# Patient Record
Sex: Female | Born: 1990 | Race: Black or African American | Hispanic: No | Marital: Single | Smoking: Never smoker
Health system: Southern US, Community
[De-identification: ages and names within clinical notes are randomized; demographics above are authoritative.]

## PROBLEM LIST (undated history)

## (undated) DIAGNOSIS — N926 Irregular menstruation, unspecified: Secondary | ICD-10-CM

## (undated) DIAGNOSIS — G4730 Sleep apnea, unspecified: Secondary | ICD-10-CM

## (undated) HISTORY — PX: LAPAROSCOPIC GASTRIC RESTRICTIVE DUODENAL PROCEDURE (DUODENAL SWITCH): SHX6667

## (undated) HISTORY — PX: ANTERIOR CRUCIATE LIGAMENT REPAIR: SHX115

---
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
## 2011-03-16 NOTE — H&P (Signed)
Sarah Horn, Sarah Horn            ACCOUNT NO.:  [id number]   MEDICAL RECORD NO.:  [id number]          PATIENT TYPE:  AMB   LOCATION:  DAY                           FACILITY:  [facility]   PHYSICIAN:  Vickki Hearing, M.D.DATE OF BIRTH:  1990/11/08   DATE OF ADMISSION:  DATE OF DISCHARGE:  LH                              HISTORY & PHYSICAL   CHIEF COMPLAINT:  Left knee pain.   HISTORY OF PRESENT ILLNESS:  This is a 20 year old female with left knee  pain.  On September 19, 2007, she had x-rays taken at [facility]  after an injury to the left knee.  The x-rays showed joint effusion but  no fracture.   She was injured chasing a ball at school.  She hit her head, lost  consciousness,her left knee hit the score board, and when she came to  the knee was still hurting.  She was given a knee brace, crutches  Vicodin, ibuprofen usual ice and compression elevation measures and came  to my office for evaluation.  She had a second fall getting out of the  shower.  She had a CT scan for workup of her loss of consciousness.  It  was normal.  She has been seen by neurologist Dr. Gerilyn Pilgrim, who has  cleared her for surgery in terms of general anesthetic.  She has been  reevaluated in the office with an MRI done on September 27, 2007, which  showed bone contusions, femoral and tibial, associated with complete  anterior cruciate ligament tear, medial and lateral collateral ligament  sprains without full-thickness tear, injuries to the meniscal root of  the posterior horn of the medial and lateral menisci, no full thickness  tear noted, delamination injury involving the medial femoral condyle  articular cartilage, and a large joint effusion.   On her last evaluation her swelling was down, her motion was sufficient  to allow for reconstruction with 120 degrees of knee flexion.   OTHER MEDICATIONS TAKEN:  None.   No known drug allergies.   Currently on Tylenol No. 3.   PAST MEDICAL HISTORY:   Negative.  No previous surgery.   PHYSICAL EXAM:  Weight 210, pulse 66, respiratory rate 14.  Her appearance was normal.  She had normal cardiovascular function.  Lymph system evaluation deferred.  Gait:  Weightbearing with crutches.  Range of motion the left knee is 0-  120.  Motor exam normal, both knees.  Stability tests left knee, 2+  Lachman, 2+ anterior drawer, pivot shift could not be performed.  Left  knee was still swollen.  SKIN:  Normal.  Neurovascular exam intact.  The patient was awake, alert and oriented x3.   Radiographs show closed growth plates, normal bone structure.   MRI findings noted above.   IMPRESSION:  1. Torn anterior cruciate ligament, left knee.  2. Torn medial meniscus, left knee.  3. Bone contusions.  4. Chondral flap tear, left medial femoral condyle.   PLAN:  Autograft ACL reconstruction, left knee.   Informed consent process was completed in the office.  We discussed  surgical and nonoperative treatment, plusses and  minuses of both,  potential complications of ACL reconstruction including but not limited  to re-tear, further episodes of instability, knee stiffness.  We  emphasized the need for postoperative rehabilitation and compliance with  that.  The patient understands she will have to wear a brace for 6  weeks.  This was done in the presence of the patient's mother as well as  the patient.      Vickki Hearing, M.D.  Electronically Signed     SEH/MEDQ  D:  10/12/2007  T:  10/13/2007  Job:  [id number]   cc:   [facility]  Fax: [phone]

## 2011-03-16 NOTE — Op Note (Signed)
NAMEMAURY, BAMBA            ACCOUNT NO.:  [id number]   MEDICAL RECORD NO.:  [id number]          PATIENT TYPE:  AMB   LOCATION:  DAY                           FACILITY:  [facility]   PHYSICIAN:  Vickki Hearing, M.D.DATE OF BIRTH:  21-Jun-1991   DATE OF PROCEDURE:  10/13/2007  DATE OF DISCHARGE:  10/13/2007                               OPERATIVE REPORT   PREOPERATIVE DIAGNOSIS:  Anterior cruciate ligament tear, left knee.   POSTOPERATIVE DIAGNOSIS:  Anterior cruciate ligament tear, left knee.   PROCEDURE:  Anterior cruciate ligament reconstruction with autograft,  left knee.  Graft was a bone-patellar-tendon-bone graft.   SURGEON:  Vickki Hearing, M.D. assisted by Trenton Founds.   ANESTHETIC:  General.   FINDINGS:  Torn ACL, chondral lesion medial femoral condyle and lateral  femoral condyle.  No specimens.  No complications.  Blood loss estimated  25 mL.  The patient did go to PACU in good condition.   BRIEF HISTORY:  This is a 20 year old female with left knee pain injured  on November 18.  X-rays were taken after the injury, showed a joint  effusion, no fracture.  The patient was injured when she was chasing a  ball at school, hit her head, lost consciousness, hit the left knee on  the score board, and was seen.  Had a CAT scan to workup her loss of  consciousness.  It was normal.  She was cleared by Dr. Gerilyn Pilgrim,  neurologist, in terms of surgical treatment needed for her knee.  MRI  done November 26 showed bone contusions consistent with anterior  cruciate ligament tear.  Medial and lateral collateral ligament sprains  were noted.  There was delamination involving the medial femoral condyle  with a large joint effusion.  The patient was seen in the office with  her mother.  Informed consent was obtained there.  Clinical signs of ACL  were present, and recommendations were made for surgery.  Options were  given for nonsurgery; the patient decided to have surgery  with the  consent of her mother.   The patient was identified in preoperative holding area as Donella Stade.  Left knee was marked surgery and countersigned by the  surgeon.  History and physical was updated.  The patient was taken to  surgery, given general anesthetic, and time-out procedure was then  completed.   Left knee was examined under anesthesia.  Collateral ligaments were  stable at 0 and 30 degrees.  Pivot shift and Lachman tests were  positive, grade 2 and 2, respectively.   Left leg was prepped and draped with sterile technique.   Tourniquet was elevated.  A second time out was taken.   Graft was taken first.  Straight incision was made over the patellar  tendon.  Subcutaneous tissue was divided.  Paratenon was isolated, split  in the midline of the patellar tendon.  The middle third of patellar  tendon was incised with bone blocks taken proximally and distally using  an oscillating saw.  Graft was repaired on the back table and placed on  a tensioner.   Diagnostic arthroscopy was  then done.  Diagnostic findings included torn  ACL, cartilage injuries of the medial and lateral femoral condyles.  These were debrided.  Menisci were evaluated and were found to be stable  with no tear.  Patellofemoral joint had some slight subluxation but  tracked back in the groove at 45 degrees flexion.   The ACL was then debrided.  The notch was debrided as well.  Notchplasty  performed.  Over-the-top position was identified with probe and  visualization.   Using the N + 7 rule, the tibial guide was set.  We used a 7-mm PCR  reference offset guide, placed in the ACL footprint posteriorly, drilled  our pin into the joint, over reamed with a 10-mm reamer, followed that  with over-the-top guide 7-mm offset, drilled the pin out the  anterolateral thigh, reamed that with a 10-mm reamer, then took a 9-mm  bone block graft, passed it through the joint and secured it with two   interference screws with the knee in extension.  The distal screw was  fixed.   Standard confirmation of proximal fixation was done prior to distal  fixation by pulling on the distal sutures.  Knee was cycled.   Once the knee was secured proximally and distally, Lachman test was  performed.  Lachman test was graded at 0.   The knee was taken through a range of motion.  Range of motion was found  to be full.   Knee was irrigated.  The graft was checked with probing and  visualization.  There was no impingement in the notch.  Closure was done  by closing the paratenon and the patellar tendon with interrupted 0  Monocryl sutures.  Subcutaneous layer was closed with 2-0 Monocryl, and  a subcutaneous running 3-0 Prolene stitch was used to close the skin.   The patient was placed in sterile dressings, Cryo/Cuff and a hinged  brace and extubated and taken to recovery room in stable condition.      Vickki Hearing, M.D.  Electronically Signed     SEH/MEDQ  D:  11/01/2007  T:  11/01/2007  Job:  [id number]

## 2011-06-10 ENCOUNTER — Other Ambulatory Visit: Payer: Self-pay | Admitting: Family Medicine

## 2011-06-10 DIAGNOSIS — N6320 Unspecified lump in the left breast, unspecified quadrant: Secondary | ICD-10-CM

## 2011-06-15 ENCOUNTER — Other Ambulatory Visit: Payer: Self-pay | Admitting: Obstetrics and Gynecology

## 2011-06-15 DIAGNOSIS — IMO0002 Reserved for concepts with insufficient information to code with codable children: Secondary | ICD-10-CM

## 2011-06-16 ENCOUNTER — Ambulatory Visit (HOSPITAL_COMMUNITY)
Admission: RE | Admit: 2011-06-16 | Discharge: 2011-06-16 | Disposition: A | Discharge status: Home / Self Care | Payer: Self-pay | Source: Ambulatory Visit | Attending: Obstetrics and Gynecology | Admitting: Obstetrics and Gynecology

## 2011-06-16 ENCOUNTER — Other Ambulatory Visit (HOSPITAL_COMMUNITY): Payer: Self-pay

## 2011-06-16 DIAGNOSIS — IMO0002 Reserved for concepts with insufficient information to code with codable children: Secondary | ICD-10-CM

## 2011-06-25 ENCOUNTER — Encounter (HOSPITAL_COMMUNITY)
Admission: RE | Admit: 2011-06-25 | Discharge: 2011-06-25 | Disposition: A | Discharge status: Home / Self Care | Payer: Medicaid Other | Source: Ambulatory Visit | Attending: General Surgery | Admitting: General Surgery

## 2011-06-25 ENCOUNTER — Encounter (HOSPITAL_COMMUNITY): Payer: Self-pay

## 2011-06-25 HISTORY — DX: Irregular menstruation, unspecified: N92.6

## 2011-06-25 LAB — SURGICAL PCR SCREEN: Staphylococcus aureus: NEGATIVE

## 2011-06-25 NOTE — Patient Instructions (Addendum)
20 Sarah Horn  06/25/2011   Your procedure is scheduled on:  Wednesday, 06/30/11  Report to [facility] at 06:15[facility].  Call this number if you have problems the morning of surgery: [phone]   Remember:   Do not eat food:After Midnight.  Do not drink clear liquids: After Midnight.  Take these medicines the morning of surgery with A SIP OF WATER: none   Do not wear jewelry, make-up or nail polish.  Do not wear lotions, powders, or perfumes. You may wear deodorant.  Do not shave 48 hours prior to surgery.  Do not bring valuables to the hospital.  Contacts, dentures or bridgework may not be worn into surgery.  Leave suitcase in the car. After surgery it may be brought to your room.  For patients admitted to the hospital, checkout time is 11:00 AM the day of discharge.   Patients discharged the day of surgery will not be allowed to drive home.  Name and phone number of your driver: Dirk Dress, mom  Special Instructions: CHG Shower Use Special Wash: 1/2 bottle night before surgery and 1/2 bottle morning of surgery.   Please read over the following fact sheets that you were given: MRSA Information, Surgical Site Infection Prevention, Anesthesia Post-op Instructions and Care and Recovery After Surgery Breast Biopsy Procedure WHY YOU NEED A BIOPSY Your caregiver has recommended that you have a breast tissue sample taken (biopsy). This is done to be certain that the lump or abnormality found in your breast is not cancerous (malignant). During a biopsy, a small piece of tissue is removed, so it can be examined under a microscope by a specialist (pathologist) who looks at tissues and cells and diagnoses abnormalities in them. Most lumps (tumors) or abnormalities, on or in the breast, are not cancerous (benign). However, biopsies are taken when your caregiver cannot be absolutely certain of what is wrong only from doing a physical exam, mammogram (breast X-ray), or other studies. A breast biopsy can  tell you whether nothing more needs to be done, or you need more surgery or another type of treatment. A biopsy is done when there is:  Any undiagnosed breast mass.   Nipple abnormalities, dimpling, crusting, ulcerations.   Calcium deposits (calcifications) or abnormalities seen on your mammogram, ultrasound, or MRI.   Suspicious changes in the breast (thickening, asymmetry) seen on mammogram.   Abnormal discharge from the nipple, especially blood.   Redness, swelling, and pain of the breast.  HOW A BIOPSY IS PERFORMED A biopsy is often performed on an outpatient basis (you go home the same day). This can be done in a hospital, clinic, or surgical center. Tissue samples (biopsies) are often done under local anesthesia (area is numbed). Sometimes general anesthetics are required, in which case you sleep through the procedure. Biopsies may remove the entire lump, a small piece of the lump, or a small sliver of tissue removed by needle. TYPES OF BREAST BIOPSY  Fine needle aspiration. A thin needle is placed through the skin, to the lump or cyst, and cells are removed.   Core needle biopsy. A large needle with a special tip is placed through the skin, to the abnormality, and a piece of tissue is removed.   Stereotactic biopsy. A core needle with a special X-ray is used, to direct the needle to the lump or abnormal area, which is difficult to feel or cannot be felt.   Vacuum-assisted biopsy. A hollow probe and a gentle vacuum remove a sample of tissue.  Ultrasound guided core needle biopsy. You lie on your stomach, with your breast through an opening, and a high frequency ultrasound helps guide the needle to the area of the abnormality.   Open biopsy. An incision is made in the breast, and a piece of the lump or the whole lump is removed.  BEFORE SURGERY  You should arrive  prior to your procedure, unless otherwise directed by your caregiver.   Check-in at the admissions desk, to fill  out necessary forms, if you are not preregistered.   There will be consent forms to sign, prior to the procedure.   There is a waiting area for your family, while you are having your biopsy.   Try to have someone with you, to drive you home.   Do not smoke for 2 weeks before the surgery.   Let your caregiver know if you develop a cold or an infection.   Do not drink alcohol for at least 24 hours before surgery.   Wear a good support bra to the surgery.  LET YOUR CAREGIVER KNOW ABOUT THE FOLLOWING:  Allergies.  Medicines taken, including herbs, eye drops, over-the-counter medicines, and creams.   Use of steroids (by mouth or creams).   Previous problems with anesthetics or Novocaine.   If you are taking aspirin or blood thinners.   Possibility of pregnancy, if this applies.  History of blood clots (thrombophlebitis).   History of bleeding or blood problems.   Previous surgery.   Other health problems.   RISKS AND COMPLICATIONS  Bleeding.   Infection.   Allergy to medicines.   Bruising and swelling of the breast.   Alteration in the shape of the breast.   Not finding the lump or abnormality.   Needing more surgery.  AFTER YOUR SURGERY  After surgery, you will be taken to the recovery area, where a nurse will watch and check your progress. Once you are awake, stable, and taking fluids well, if there are no other problems, you will be allowed to go home.   Ice packs applied to your operative site may help with discomfort and keep the swelling down.   You may resume normal diet and activities as directed. Avoid strenuous activities affecting the arm on the side of the biopsy, such as tennis, swimming, heavy lifting (more than 10 pounds) or pulling.   Bruising in the breast is normal following this procedure.   Wearing a support bra, even to bed, may be more comfortable. The bra will also help keep the dressing on.   Change dressings as directed.   Your  doctor may apply a pressure dressing on your breast for 24 to 48 hours.   Only take over-the-counter or prescription medicines for pain, discomfort, or fever as directed by your caregiver.   Do not take aspirin, because it can cause bleeding.  HOME CARE INSTRUCTIONS  You may resume your usual diet.   Have someone drive you home after the surgery.   Do not do any exercise, driving, lifting or general activities without your caregiver's permission.   Take medicines and over-the-counter medicines, as ordered by your caregiver.   Keep your postoperative appointments as recommended.   Do not drink alcohol while taking pain medicine.  FINDING OUT THE RESULTS OF YOUR TEST Not all test results are available during your visit. If your test results are not back during the visit, make an appointment with your caregiver to find out the results. Do not assume everything is normal if  you have not heard from your caregiver or the medical facility. It is important for you to follow up on all of your test results.  SEEK MEDICAL CARE IF:  You notice redness, swelling, or increasing pain in the wound.   You notice a bad smell coming from the wound or dressing.   You develop a rash.   You need stronger pain medicine.   You are having an allergic reaction or problems with your medicines.  SEEK IMMEDIATE MEDICAL CARE IF:  You have difficulty breathing.   You have an oral temperature above 101, not controlled by medicine.   There is increased bleeding (more than a small spot) from the wound.   Pus is coming from the wound.   The wound is breaking open.  Document Released: 10/18/2005 Document Re-Released: 11/09/2009 [facility] Patient Information 2011 [place], [place].Instructions Following General Anesthetic, Adult A nurse specialized in giving anesthesia (anesthetist) or a doctor specialized in giving anesthesia (anesthesiologist) gave you a medicine that made you sleep while a procedure was  performed. For as long as 24 hours following this procedure, you may feel:  Dizzy.   Weak.   Drowsy.  AFTER YOUR SURGERY 1. After surgery, you will be taken to the recovery area where a nurse will monitor your progress. You will be allowed to go home when you are awake, stable, taking fluids well, and without complications.  2.  3. For the first 24 hours following an anesthetic:   Have a responsible person with you.   Do not drive a car. If you are alone, do not take public transportation.   Do not drink alcohol.   Do not take medicine that has not been prescribed by your caregiver.   Do not sign important papers or make important decisions.   You may resume normal diet and activities as directed.   Change bandages (dressings) as directed.   Only take over-the-counter or prescription medicines for pain, discomfort, or fever as directed by your caregiver.  If you have questions or problems that seem related to the anesthetic, call the hospital and ask for the anesthetist or anesthesiologist on call. SEEK IMMEDIATE MEDICAL CARE IF:  You develop a rash.   You have difficulty breathing.   You have chest pain.   You develop any allergic problems.  Document Released: 01/24/2001 Document Re-Released: 01/12/2010 [facility] Patient Information 2011 [place], [place].

## 2011-06-29 NOTE — H&P (Signed)
  NTS SOAP Note  Vital Signs:  Vitals as of: 06/24/2011: Systolic 165: Diastolic 133: Heart Rate 84: Temp 38F: Height 93ft 6in: Weight 270Lbs 0 Ounces: Pain Level 0: BMI 44  BMI : 43.58 kg/m2  Subjective: This 19 Years 2 Months old Female presents for of breast mass on L side.  Noted last month on self exam.  No significant change in size.  No prior history of similar lesion or masses.  No nipple discharge or retraction.  No skin changes.  No noted change with cycle but notes the last several have been irregular.  No consistent cycle for 3 month intervals.  When menses occurs it is prolonged at 2 wk durations.  Has been recommended to start OCP to help but has not started do to finding of breast mass.  No night sweats.  No wt loss.  Review of Symptoms:  Constitutional:unremarkable Head:unremarkable Eyes:unremarkable Nose/Mouth/Throat:unremarkable Cardiovascular:unremarkable Respiratory:unremarkable Gastrointestinal:unremarkable Genitourinary:unremarkable Musculoskeletal:unremarkable Skin:unremarkable as per HPI Hematolgic/Lymphatic:unremarkable Allergic/Immunologic:unremarkable    Past Medical History:Obtained   Past Medical History  Pregnancy Gravida:  0 Pregnancy Para:  0 Surgical History: ACL Medical Problems: None Psychiatric History: none Allergies: NKDA Medications: None   Social History:Obtained   Social History  Preferred Language: English ([place]) Race:  Black or African American Ethnicity: Not Hispanic / Latino Age: 71 Years 2 Months Marital Status:  S Alcohol:  No Recreational drug(s):  No   Smoking Status: Never smoker reviewed on 06/24/2011  Family History:Obtained   Family History  Is there a family history ZO:XWRUEAVW Aunt Breast Ca 50's No female breast CA             Maternal Grandmother: female CA (unsure of type/non-breast)  50's   Medication Allergies:   Allergies Insert  Code:   Objective Information: General:Well appearing, well nourished in no distress.Obese. Skin:no rash or prominent lesions Head:Atraumatic; no masses; no abnormalities Eyes:conjunctiva clear, EOM intact, PERRL Mouth:Mucous membranes moist, no mucosal lesions. Neck:Supple without lymphadenopathy.  Heart:RRR, no murmur or gallop.  Normal S1, S2.  No S3, S4.  Lungs:CTA bilaterally, no wheezes, rhonchi, rales.  Breathing unlabored. Right breast is unremarkable.  No abnomalities noted.  On L side there is a 3x4 cm (roughly) regularly shaped mass.  mobile.  Nontender.  No LN on either side.  No nipple discharge. Abdomen:Soft, NT/ND, no HSM, no masses. Extremities:No deformities, clubbing, cyanosis, or edema.      U/S  L breast:  BIRADS 4 mass on L breast at 3 o'clock position. Assessment:  Diagnosis &amp; Procedure: DiagnosisCode: 611.72, ProcedureCode: 09811,   Orders:    Plan:  Options discussed including further radiological evaluation, guided biopsy, or surgical open bx.  Due to size and patient concern, she wishes to proceed with surgical bx.  Therefore, I do not feel that u/s guided is needed and this was discussed with the patient.  We will proceed at her earliest convenience.  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  Patient Education:Alternative treatments to surgery were discussed with patient (and family).Risks and benefits  of procedure were fully explained to the patient (and family) who gave informed consent. Patient/family questions were addressed.  Follow-up:Pending Surgery

## 2011-06-30 ENCOUNTER — Encounter (HOSPITAL_COMMUNITY): Payer: Self-pay | Admitting: *Deleted

## 2011-06-30 ENCOUNTER — Ambulatory Visit (HOSPITAL_COMMUNITY): Payer: Medicaid Other | Admitting: Anesthesiology

## 2011-06-30 ENCOUNTER — Other Ambulatory Visit: Payer: Self-pay | Admitting: General Surgery

## 2011-06-30 ENCOUNTER — Encounter (HOSPITAL_COMMUNITY)
Admission: RE | Disposition: A | Discharge status: Home / Self Care | Payer: Self-pay | Source: Ambulatory Visit | Attending: General Surgery

## 2011-06-30 ENCOUNTER — Ambulatory Visit (HOSPITAL_COMMUNITY)
Admission: RE | Admit: 2011-06-30 | Discharge: 2011-06-30 | Disposition: A | Discharge status: Home / Self Care | Payer: Medicaid Other | Source: Ambulatory Visit | Attending: General Surgery | Admitting: General Surgery

## 2011-06-30 ENCOUNTER — Encounter (HOSPITAL_COMMUNITY): Payer: Self-pay | Admitting: Anesthesiology

## 2011-06-30 DIAGNOSIS — N6320 Unspecified lump in the left breast, unspecified quadrant: Secondary | ICD-10-CM

## 2011-06-30 DIAGNOSIS — D249 Benign neoplasm of unspecified breast: Secondary | ICD-10-CM | POA: Insufficient documentation

## 2011-06-30 HISTORY — PX: BREAST BIOPSY: SHX20

## 2011-06-30 SURGERY — BREAST BIOPSY
Anesthesia: General | Site: Breast | Laterality: Left | Wound class: Clean

## 2011-06-30 MED ORDER — MIDAZOLAM HCL 2 MG/2ML IJ SOLN
INTRAMUSCULAR | Status: AC
  Filled 2011-06-30: qty 2

## 2011-06-30 MED ORDER — LACTATED RINGERS IV SOLN
INTRAVENOUS | Status: DC
  Administered 2011-06-30: 1000 mL via INTRAVENOUS
  Administered 2011-06-30: 08:00:00 via INTRAVENOUS

## 2011-06-30 MED ORDER — LIDOCAINE HCL (PF) 1 % IJ SOLN
INTRAMUSCULAR | Status: AC
  Filled 2011-06-30: qty 30

## 2011-06-30 MED ORDER — BUPIVACAINE HCL (PF) 0.5 % IJ SOLN
INTRAMUSCULAR | Status: AC
  Filled 2011-06-30: qty 30

## 2011-06-30 MED ORDER — FENTANYL CITRATE 0.05 MG/ML IJ SOLN
INTRAMUSCULAR | Status: DC | PRN
  Administered 2011-06-30 (×6): 50 ug via INTRAVENOUS

## 2011-06-30 MED ORDER — ONDANSETRON HCL 4 MG/2ML IJ SOLN: 4.0000 mg | Freq: Once | INTRAMUSCULAR | Status: DC | PRN

## 2011-06-30 MED ORDER — PROPOFOL 10 MG/ML IV EMUL
INTRAVENOUS | Status: AC
  Filled 2011-06-30: qty 20

## 2011-06-30 MED ORDER — FENTANYL CITRATE 0.05 MG/ML IJ SOLN
INTRAMUSCULAR | Status: AC
Start: 2011-06-30 — End: 2011-06-30
  Administered 2011-06-30: 25 ug via INTRAVENOUS
  Filled 2011-06-30: qty 2

## 2011-06-30 MED ORDER — HYDROCODONE-ACETAMINOPHEN 5-325 MG PO TABS: 1.0000 | ORAL_TABLET | ORAL | Status: AC | PRN

## 2011-06-30 MED ORDER — BUPIVACAINE HCL (PF) 0.5 % IJ SOLN
INTRAMUSCULAR | Status: DC | PRN
  Administered 2011-06-30: 10 mL

## 2011-06-30 MED ORDER — IPRATROPIUM-ALBUTEROL 18-103 MCG/ACT IN AERO
INHALATION_SPRAY | RESPIRATORY_TRACT | Status: DC | PRN
  Administered 2011-06-30: 2 via RESPIRATORY_TRACT

## 2011-06-30 MED ORDER — CELECOXIB 100 MG PO CAPS
ORAL_CAPSULE | ORAL | Status: AC
  Administered 2011-06-30: 100 mg via ORAL
  Filled 2011-06-30: qty 4

## 2011-06-30 MED ORDER — FENTANYL CITRATE 0.05 MG/ML IJ SOLN
25.0000 ug | INTRAMUSCULAR | Status: DC | PRN
  Administered 2011-06-30 (×2): 25 ug via INTRAVENOUS

## 2011-06-30 MED ORDER — FENTANYL CITRATE 0.05 MG/ML IJ SOLN
INTRAMUSCULAR | Status: AC
  Filled 2011-06-30: qty 2

## 2011-06-30 MED ORDER — LIDOCAINE HCL (PF) 1 % IJ SOLN
INTRAMUSCULAR | Status: AC
  Filled 2011-06-30: qty 5

## 2011-06-30 MED ORDER — PROPOFOL 10 MG/ML IV EMUL
INTRAVENOUS | Status: DC | PRN
  Administered 2011-06-30: 150 mg via INTRAVENOUS
  Administered 2011-06-30: 50 mg via INTRAVENOUS

## 2011-06-30 MED ORDER — SODIUM CHLORIDE 0.9 % IR SOLN
Status: DC | PRN
  Administered 2011-06-30: 1000 mL

## 2011-06-30 MED ORDER — MIDAZOLAM HCL 5 MG/5ML IJ SOLN
INTRAMUSCULAR | Status: DC | PRN
  Administered 2011-06-30 (×2): 1 mg via INTRAVENOUS

## 2011-06-30 MED ORDER — LIDOCAINE HCL 1 % IJ SOLN
INTRAMUSCULAR | Status: DC | PRN
  Administered 2011-06-30: 10 mg via INTRADERMAL

## 2011-06-30 MED ORDER — CELECOXIB 100 MG PO CAPS
400.0000 mg | ORAL_CAPSULE | Freq: Every day | ORAL | Status: AC
  Administered 2011-06-30 (×4): 100 mg via ORAL

## 2011-06-30 MED ORDER — FENTANYL CITRATE 0.05 MG/ML IJ SOLN
INTRAMUSCULAR | Status: AC
  Administered 2011-06-30: 25 ug via INTRAVENOUS
  Filled 2011-06-30: qty 2

## 2011-06-30 MED ORDER — ALBUTEROL SULFATE HFA 108 (90 BASE) MCG/ACT IN AERS
INHALATION_SPRAY | RESPIRATORY_TRACT | Status: AC
  Filled 2011-06-30: qty 6.7

## 2011-06-30 MED ORDER — MIDAZOLAM HCL 2 MG/2ML IJ SOLN
1.0000 mg | INTRAMUSCULAR | Status: DC | PRN
  Administered 2011-06-30: 2 mg via INTRAVENOUS

## 2011-06-30 SURGICAL SUPPLY — 35 items
APL SKNCLS STERI-STRIP NONHPOA (GAUZE/BANDAGES/DRESSINGS) ×1
BAG HAMPER (MISCELLANEOUS) ×2 IMPLANT
BENZOIN TINCTURE PRP APPL 2/3 (GAUZE/BANDAGES/DRESSINGS) ×2 IMPLANT
CLOSURE STERI STRIP 1/2 X4 (GAUZE/BANDAGES/DRESSINGS) ×1 IMPLANT
CLOTH BEACON ORANGE TIMEOUT ST (SAFETY) ×2 IMPLANT
COVER LIGHT HANDLE STERIS (MISCELLANEOUS) ×4 IMPLANT
DURAPREP 26ML APPLICATOR (WOUND CARE) ×2 IMPLANT
ELECT REM PT RETURN 9FT ADLT (ELECTROSURGICAL) ×2
ELECTRODE REM PT RTRN 9FT ADLT (ELECTROSURGICAL) ×1 IMPLANT
FORMALIN 10 PREFIL 120ML (MISCELLANEOUS) ×2 IMPLANT
GLOVE BIOGEL PI IND STRL 7.0 (GLOVE) IMPLANT
GLOVE BIOGEL PI IND STRL 7.5 (GLOVE) ×1 IMPLANT
GLOVE BIOGEL PI INDICATOR 7.0 (GLOVE) ×2
GLOVE BIOGEL PI INDICATOR 7.5 (GLOVE) ×1
GLOVE ECLIPSE 7.0 STRL STRAW (GLOVE) ×2 IMPLANT
GLOVE OPTIFIT SS 6.5 STRL BRWN (GLOVE) ×1 IMPLANT
GOWN BRE IMP SLV AUR XL STRL (GOWN DISPOSABLE) ×6 IMPLANT
KIT ROOM TURNOVER APOR (KITS) ×2 IMPLANT
MANIFOLD NEPTUNE II (INSTRUMENTS) ×2 IMPLANT
MARKER SKIN DUAL TIP RULER LAB (MISCELLANEOUS) ×2 IMPLANT
NDL HYPO 18GX1.5 BLUNT FILL (NEEDLE) IMPLANT
NDL HYPO 25X1 1.5 SAFETY (NEEDLE) IMPLANT
NEEDLE HYPO 18GX1.5 BLUNT FILL (NEEDLE) IMPLANT
NEEDLE HYPO 25X1 1.5 SAFETY (NEEDLE) IMPLANT
NS IRRIG 1000ML POUR BTL (IV SOLUTION) ×2 IMPLANT
PACK MINOR (CUSTOM PROCEDURE TRAY) ×2 IMPLANT
PAD ARMBOARD 7.5X6 YLW CONV (MISCELLANEOUS) ×2 IMPLANT
SET BASIN LINEN APH (SET/KITS/TRAYS/PACK) ×2 IMPLANT
SPONGE GAUZE 2X2 8PLY STRL LF (GAUZE/BANDAGES/DRESSINGS) IMPLANT
STRIP CLOSURE SKIN 1/2X4 (GAUZE/BANDAGES/DRESSINGS) ×2 IMPLANT
SUT MNCRL AB 4-0 PS2 18 (SUTURE) IMPLANT
SUT VIC AB 3-0 SH 27 (SUTURE)
SUT VIC AB 3-0 SH 27X BRD (SUTURE) IMPLANT
SYR BULB IRRIGATION 50ML (SYRINGE) ×2 IMPLANT
SYR CONTROL 10ML LL (SYRINGE) IMPLANT

## 2011-06-30 NOTE — Anesthesia Preprocedure Evaluation (Signed)
Anesthesia Evaluation  Name, MR# and DOB Patient awake  General Assessment Comment  Reviewed: Allergy & Precautions, H&P , NPO status , Patient's Chart, lab work & pertinent test results, reviewed documented beta blocker date and time   History of Anesthesia Complications Negative for: history of anesthetic complications  Airway Mallampati: III  Neck ROM: Full    Dental  (+) Teeth Intact   Pulmonary    pulmonary exam normalPulmonary Exam Normal     Cardiovascular Regular Normal    Neuro/Psych   GI/Hepatic/Renal   Endo/Other    Abdominal   Musculoskeletal   Hematology   Peds  Reproductive/Obstetrics    Anesthesia Other Findings             Anesthesia Physical Anesthesia Plan  ASA: I  Anesthesia Plan: General   Post-op Pain Management:    Induction: Intravenous  Airway Management Planned: LMA  Additional Equipment:   Intra-op Plan:   Post-operative Plan:   Informed Consent: I have reviewed the patients History and Physical, chart, labs and discussed the procedure including the risks, benefits and alternatives for the proposed anesthesia with the patient or authorized representative who has indicated his/her understanding and acceptance.     Plan Discussed with:   Anesthesia Plan Comments:         Anesthesia Quick Evaluation

## 2011-06-30 NOTE — Anesthesia Postprocedure Evaluation (Signed)
  Anesthesia Post-op Note  Patient: Sarah Horn  Procedure(s) Performed:  BREAST BIOPSY  Patient Location: PACU  Anesthesia Type: General  Level of Consciousness: alert   Airway and Oxygen Therapy: Patient Spontanous Breathing  Post-op Pain: mild  Post-op Assessment: Patient's Cardiovascular Status Stable, Respiratory Function Stable, Patent Airway and No signs of Nausea or vomiting  Post-op Vital Signs: stable  Complications: No apparent anesthesia complications

## 2011-06-30 NOTE — Anesthesia Procedure Notes (Addendum)
Procedure Name: LMA Insertion Date/Time: 06/30/2011 8:17 AM Performed by: Minerva Areola Pre-anesthesia Checklist: Patient identified, Patient being monitored, Emergency Drugs available, Timeout performed and Suction available Patient Re-evaluated:Patient Re-evaluated prior to inductionOxygen Delivery Method: Circle System Utilized Preoxygenation: Pre-oxygenation with 100% oxygen Intubation Type: IV induction Ventilation: Mask ventilation without difficulty LMA: LMA inserted LMA Size: 4.0 Number of attempts: 1 Placement Confirmation: positive ETCO2 and breath sounds checked- equal and bilateral

## 2011-06-30 NOTE — Op Note (Signed)
Patient:  Sarah Horn  DOB:  19-Aug-1991  MRN:  [id number]   Preop Diagnosis:  Left breast mass  Postop Diagnosis:  The same  Procedure:  Excisional biopsy of left breast mass  Surgeon:  Dr. Tilford Pillar  Anes:  General endotracheal  Indications:  Patient is a 20 year old female presented to my office with a history of a palpable mass on the upper outer quadrant of the left breast at approximately the 3:00 position. Due to her concerns and desire to have the mass removed we did discuss excisional biopsy. Risks benefits alternatives were discussed. She consented for the planned procedure.  Procedure note:  Patient was taken to the OR was placed in a supine position on the OR table. At this point general anesthetic was mentioned and the patient was endotracheally intubated by anesthesia. At this point her left chest and arm was prepped with DuraPrep solution. The surgical field was draped in standard fashion. A marking pen was utilized to mark the planned periareolar incision. A 15 blade scalpel was utilized to create the initial skin incision an additional dissection down through the breast tissue was carried out using electrocautery. The mass was identified and carefully dissected around again using electrocautery. Upon adequately mobilizing it was delivered up into the surgical field. At this point the posterior attachments were divided again using electrocautery. Once free the mass was sent as a permanent specimen to pathology. Palpation of the surgical wound did not demonstrate any additional nodularity or masses. Hemostasis was excellent the surgical field was irrigated with a copious amount of sterile saline. The deep breast tissue was reapproximated using a 3-0 Vicryl in running continuous fashion. The skin edges were reapproximated using a 4-0 Monocryl a running continuous subcuticular stitch. The skin was washed dried moist dry towel after the local anesthetic was instilled. Benzoin  is applied around the incision. Half-inch Steri-Strips are placed. The drapes removed the patient was allowed to come out of general anesthetic was transferred back to a regular hospital bed in stable condition. At the conclusion of procedure all instrument sponge and needle counts are correct. Patient tolerated procedure extremely well.  Complications:  None  EBL:  Minimal  Specimen:  Left breast mass

## 2011-06-30 NOTE — Transfer of Care (Signed)
Immediate Anesthesia Transfer of Care Note  Patient: Sarah Horn  Procedure(s) Performed:  BREAST BIOPSY  Patient Location: PACU  Anesthesia Type: General  Level of Consciousness: awake  Airway & Oxygen Therapy: Patient Spontanous Breathing and non-rebreather face mask  Post-op Assessment: Report given to PACU RN, Post -op Vital signs reviewed and stable and Patient moving all extremities  Post vital signs: Reviewed and stable  Complications: No apparent anesthesia complications

## 2011-06-30 NOTE — Progress Notes (Signed)
Ice pack to site

## 2011-06-30 NOTE — Interval H&P Note (Signed)
History and Physical Interval Note:   06/30/2011   7:53 AM   Sarah Horn  has presented today for surgery, with the diagnosis of Breast mass [611.72]  The various methods of treatment have been discussed with the patient and family. After consideration of risks, benefits and other options for treatment, the patient has consented to  Procedure(s): BREAST BIOPSY as a surgical intervention .  I have reviewed the patients' chart and labs.  Questions were answered to the patient's satisfaction.     Fabio Bering  MD

## 2011-07-06 ENCOUNTER — Encounter (HOSPITAL_COMMUNITY): Payer: Self-pay | Admitting: General Surgery

## 2011-08-09 LAB — HCG, QUANTITATIVE, PREGNANCY: hCG, Beta Chain, Quant, S: <2

## 2011-08-09 LAB — HEMOGLOBIN AND HEMATOCRIT, BLOOD
HCT: 38.5
Hemoglobin: 12.8

## 2011-08-10 LAB — URINALYSIS, ROUTINE W REFLEX MICROSCOPIC
Bilirubin Urine: NEGATIVE
Hgb urine dipstick: NEGATIVE
Ketones, ur: NEGATIVE
Nitrite: NEGATIVE
Specific Gravity, Urine: 1.02
pH: 6.5

## 2012-11-04 IMAGING — US US BREAST*L*
1 series · 4 of 4 positions shown · non-contrast
Comparison: None.

CLINICAL DATA: Questioned palpable mass left breast three o'clock
location per patient.

LEFT BREAST ULTRASOUND

[Series 1: us breast*left* · 0.08mm/px · 4 of 4 slices shown]
[im 1/4]
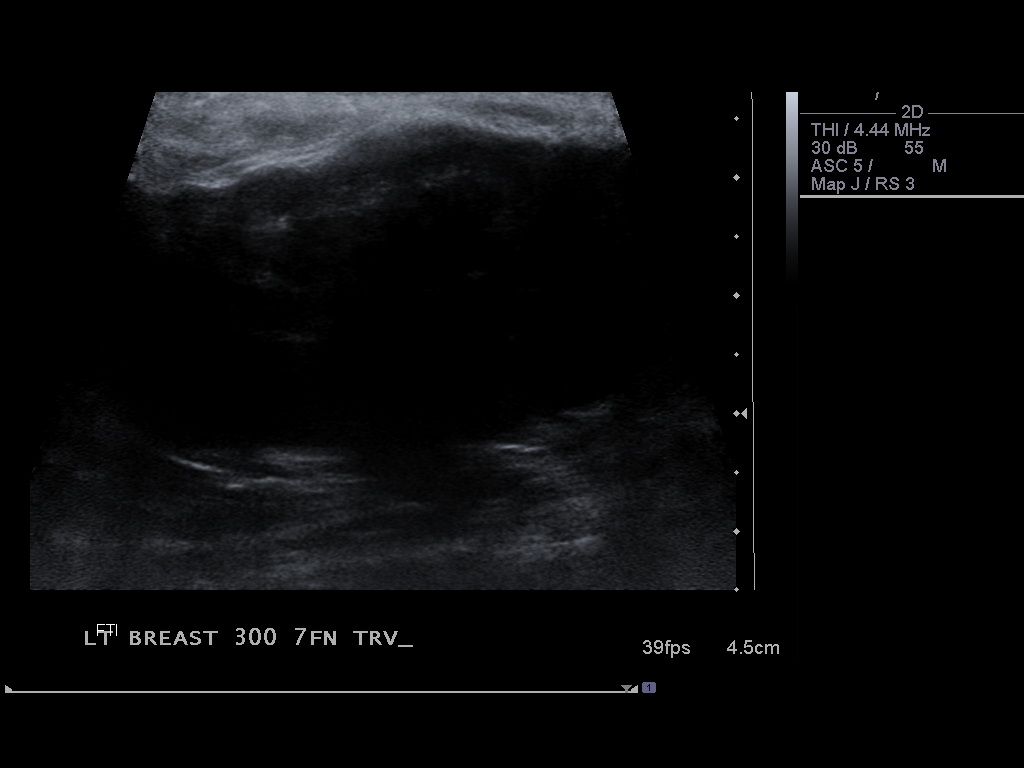
[im 2/4]
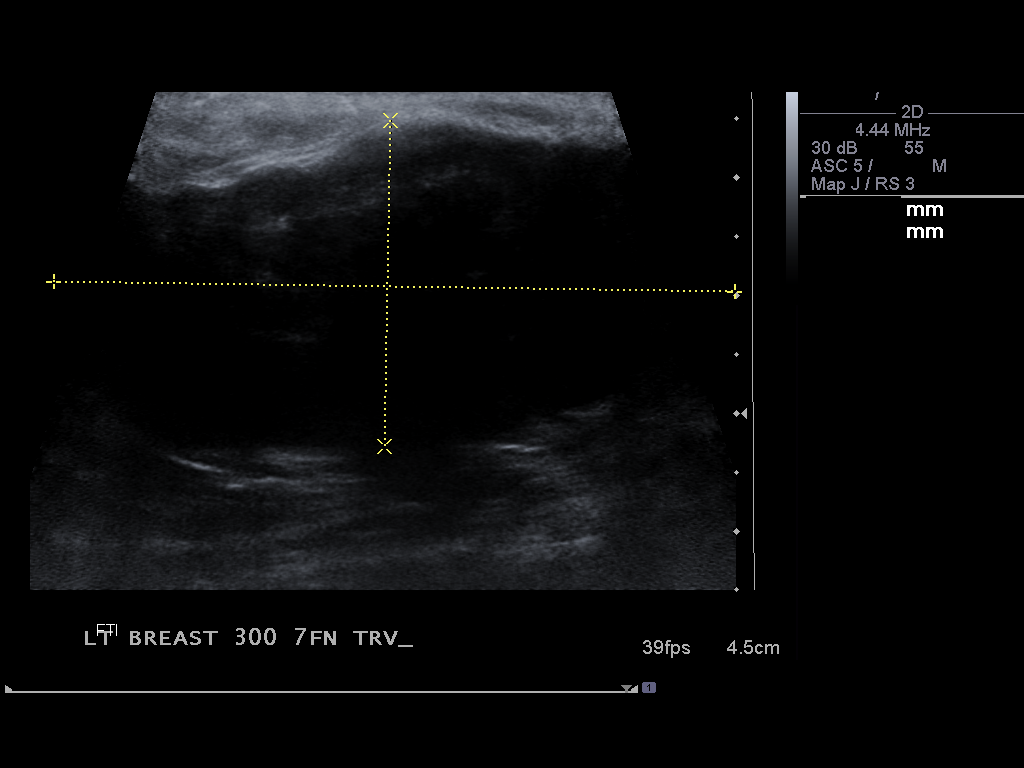
[im 3/4]
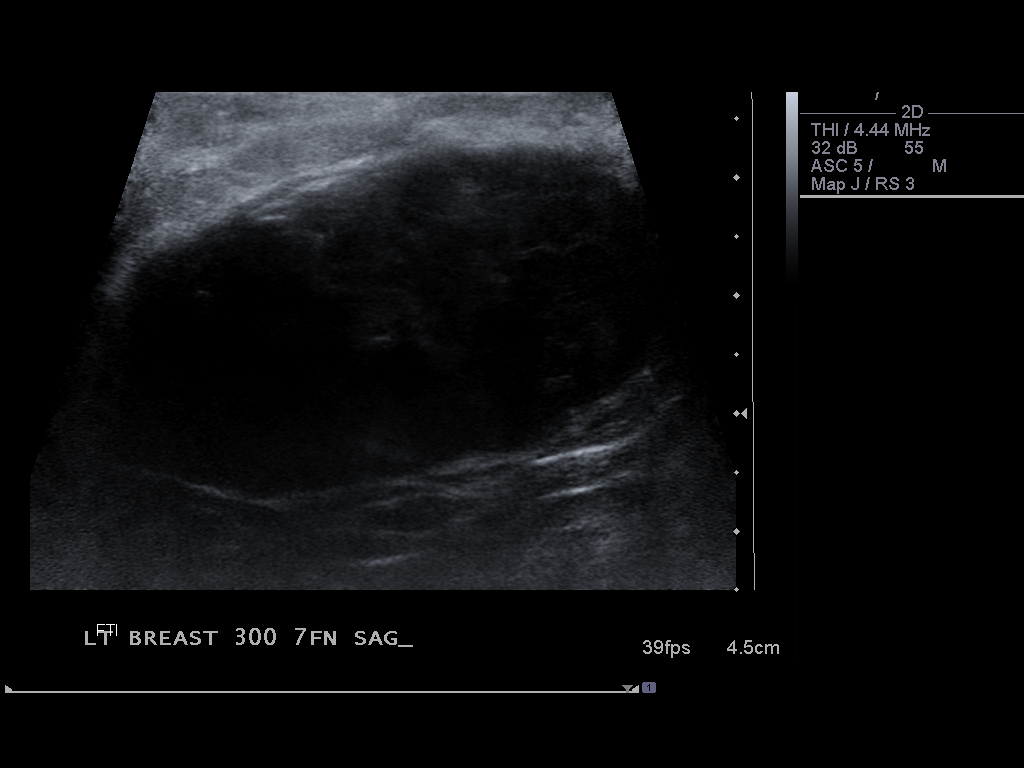
[im 4/4]
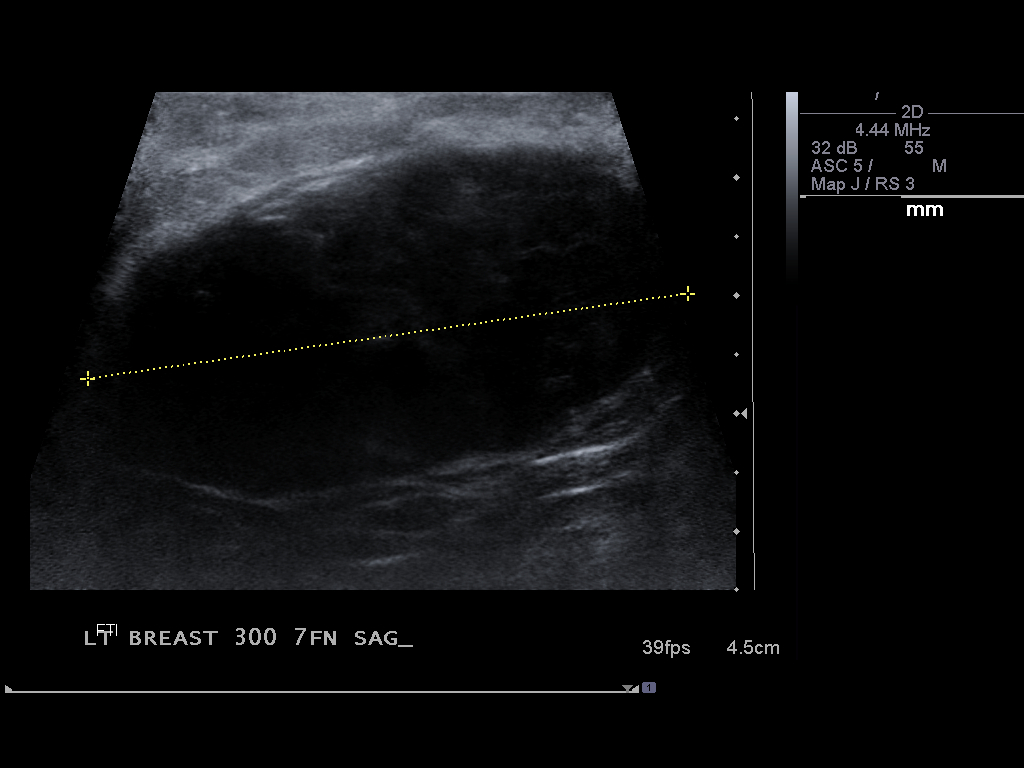

[4 of 4 positions shown; findings below may reference images not displayed]

On physical exam, I palpate a mobile oval mass spanning
approximately 5-6 cm in the left breast three o'clock location.
FINDINGS: Ultrasound is performed, showing a hypoechoic oval
circumscribed mass in the left breast three o'clock location
measuring at least 5.8 x 5.2 x 2.8 cm.  This corresponds to the
questioned palpable finding.
IMPRESSION: Left breast mass 3 o'clock location with imaging features and size
suggestive of a giant fibroadenoma versus phyllodes tumor.
Malignancy is less likely but possible.  As part of the free
clinic/[facility] program, surgical consultation will be arranged
prior to ultrasound-guided left breast core needle biopsy for the
purposes of preoperative surgical planning.  Ultimately, excision
is recommended due to the size of the mass. Findings and
recommendations discussed with the patient and provided in written
form at the time of the exam. Zohar Hijazi at the free clinic of
[HOSPITAL] was also contacted with this information and surgical
consultation and ultrasound guided core needle biopsy will be
arranged subsequently.

BI-RADS CATEGORY 4:  Suspicious abnormality - biopsy should be
considered.

## 2013-07-11 ENCOUNTER — Emergency Department (HOSPITAL_COMMUNITY)
Admission: EM | Admit: 2013-07-11 | Discharge: 2013-07-11 | Disposition: A | Discharge status: Home / Self Care | Payer: PRIVATE HEALTH INSURANCE | Attending: Emergency Medicine | Admitting: Emergency Medicine

## 2013-07-11 ENCOUNTER — Encounter (HOSPITAL_COMMUNITY): Payer: Self-pay | Admitting: *Deleted

## 2013-07-11 ENCOUNTER — Emergency Department (HOSPITAL_COMMUNITY): Payer: PRIVATE HEALTH INSURANCE

## 2013-07-11 DIAGNOSIS — S8990XA Unspecified injury of unspecified lower leg, initial encounter: Secondary | ICD-10-CM | POA: Insufficient documentation

## 2013-07-11 DIAGNOSIS — Z8742 Personal history of other diseases of the female genital tract: Secondary | ICD-10-CM | POA: Insufficient documentation

## 2013-07-11 DIAGNOSIS — R296 Repeated falls: Secondary | ICD-10-CM | POA: Insufficient documentation

## 2013-07-11 DIAGNOSIS — Y9229 Other specified public building as the place of occurrence of the external cause: Secondary | ICD-10-CM | POA: Insufficient documentation

## 2013-07-11 DIAGNOSIS — Y9302 Activity, running: Secondary | ICD-10-CM | POA: Insufficient documentation

## 2013-07-11 DIAGNOSIS — M25562 Pain in left knee: Secondary | ICD-10-CM

## 2013-07-11 MED ORDER — IBUPROFEN 800 MG PO TABS: 800.0000 mg | ORAL_TABLET | Freq: Three times a day (TID) | ORAL | Status: DC

## 2013-07-11 MED ORDER — IBUPROFEN 800 MG PO TABS
800.0000 mg | ORAL_TABLET | Freq: Once | ORAL | Status: AC
  Administered 2013-07-11: 800 mg via ORAL
  Filled 2013-07-11: qty 1

## 2013-07-11 MED ORDER — HYDROCODONE-ACETAMINOPHEN 5-325 MG PO TABS: ORAL_TABLET | ORAL | Status: DC

## 2013-07-11 MED ORDER — HYDROCODONE-ACETAMINOPHEN 5-325 MG PO TABS
2.0000 | ORAL_TABLET | Freq: Once | ORAL | Status: AC
  Administered 2013-07-11: 2 via ORAL
  Filled 2013-07-11: qty 2

## 2013-07-11 NOTE — ED Notes (Signed)
Pt at school for PT and had left knee buckle and fell, states able to put some weight on knee

## 2013-07-13 NOTE — ED Provider Notes (Signed)
CSN: [id number]     Arrival date & time 07/11/13  1719 History   First MD Initiated Contact with Patient 07/11/13 1732     Chief Complaint  Patient presents with  . Knee Pain   (Consider location/radiation/quality/duration/timing/severity/associated sxs/prior Treatment) Patient is a 22 y.o. female presenting with knee pain. The history is provided by the patient.  Knee Pain Location:  Knee Time since incident: just prior to ED arrival. Injury: yes   Mechanism of injury: fall   Fall:    Fall occurred:  Running   Impact surface:  Designer, fashion/clothing of impact:  Knees   Entrapped after fall: no   Knee location:  L knee Pain details:    Quality:  Throbbing   Radiates to:  Does not radiate   Severity:  Moderate   Onset quality:  Sudden   Timing:  Constant   Progression:  Unchanged Chronicity:  New Dislocation: no   Foreign body present:  No foreign bodies Prior injury to area:  Yes (hx of ACL repair as a teen) Relieved by:  Nothing Worsened by:  Bearing weight Ineffective treatments:  None tried Associated symptoms: no back pain, no fever, no neck pain, no numbness, no swelling and no tingling     Past Medical History  Diagnosis Date  . Irregular menstrual cycle    Past Surgical History  Procedure Laterality Date  . Anterior cruciate ligament repair      left, [facility]; [place]  . Breast biopsy  06/30/2011    Procedure: BREAST BIOPSY;  Surgeon: Fabio Bering;  Location: [facility];  Service: General;  Laterality: Left;   Family History  Problem Relation Age of Onset  . Anesthesia problems Neg Hx    History  Substance Use Topics  . Smoking status: Never Smoker   . Smokeless tobacco: Not on file  . Alcohol Use: No   OB History   Grav Para Term Preterm Abortions TAB SAB Ect Mult Living                 Review of Systems  Constitutional: Negative for fever and chills.  HENT: Negative for neck pain.   Musculoskeletal: Positive for joint swelling and arthralgias. Negative for  back pain.  Skin: Negative for color change and wound.  All other systems reviewed and are negative.    Allergies  Review of patient's allergies indicates no known allergies.  Home Medications   Current Outpatient Rx  Name  Route  Sig  Dispense  Refill  . ibuprofen (ADVIL,MOTRIN) 800 MG tablet   Oral   Take 800 mg by mouth every 8 (eight) hours as needed for pain.          Marland Kitchen HYDROcodone-acetaminophen (NORCO/VICODIN) 5-325 MG per tablet      Take one-two tabs po q 4-6 hrs prn pain   20 tablet   0   . ibuprofen (ADVIL,MOTRIN) 800 MG tablet   Oral   Take 1 tablet (800 mg total) by mouth 3 (three) times daily.   21 tablet   0    BP 118/77  Pulse 92  Temp(Src) 98.3 F (36.8 C) (Oral)  Resp 20  Ht 5\' 5"  (1.651 m)  Wt 260 lb (117.935 kg)  BMI 43.27 kg/m2  SpO2 99%  LMP 06/22/2013 Physical Exam  Nursing note and vitals reviewed. Constitutional: She is oriented to person, place, and time. She appears well-developed and well-nourished. No distress.  Cardiovascular: Normal rate, regular rhythm, normal heart sounds and  intact distal pulses.   Pulmonary/Chest: Effort normal and breath sounds normal. No respiratory distress.  Musculoskeletal: She exhibits tenderness. She exhibits no edema.       Left knee: She exhibits decreased range of motion. She exhibits no swelling, no effusion, no ecchymosis, no deformity, no erythema and normal patellar mobility. Tenderness found. Medial joint line tenderness noted. No patellar tendon tenderness noted.       Legs: ttp of the anterior left knee.  No erythema, effusion, or step-off deformity.  DP pulse brisk, distal sensation intact. Calf is soft and NT.  Neurological: She is alert and oriented to person, place, and time. She exhibits normal muscle tone. Coordination normal.  Skin: Skin is warm and dry. No erythema.    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  Relatively preserved joint spaces.  No acute fracture or large joint effusion. Obese body habitus.  Postop changes from prior ACL repair.  IMPRESSION: Progressive arthritic changes.  Prior ACL reconstruction.  No acute finding by plain radiography.   Original Report Authenticated By: Judie Petit. Miles Costain, M.D.    MDM   1. Knee pain, acute, left     Knee immobilizer applied, crutches given.  Pain improved, remains nv intact.    Given hx of previous ACL injury, patient advised of possible internal derangement.  She agrees to elevate, ice and close f/u with orthopedics.  Referral info given.    Patient is feeling better, appears stable for discharge.      Lesha Jager L. Trisha Mangle, PA-C 07/13/13 2138

## 2013-07-17 ENCOUNTER — Encounter: Payer: Self-pay | Admitting: Orthopedic Surgery

## 2013-07-17 ENCOUNTER — Ambulatory Visit (INDEPENDENT_AMBULATORY_CARE_PROVIDER_SITE_OTHER): Payer: PRIVATE HEALTH INSURANCE | Admitting: Orthopedic Surgery

## 2013-07-17 VITALS — BP 148/88 | Ht 65.0 in | Wt 260.0 lb

## 2013-07-17 DIAGNOSIS — S83242A Other tear of medial meniscus, current injury, left knee, initial encounter: Secondary | ICD-10-CM

## 2013-07-17 DIAGNOSIS — IMO0002 Reserved for concepts with insufficient information to code with codable children: Secondary | ICD-10-CM

## 2013-07-17 MED ORDER — IBUPROFEN 800 MG PO TABS: 800.0000 mg | ORAL_TABLET | Freq: Three times a day (TID) | ORAL | Status: DC

## 2013-07-17 MED ORDER — HYDROCODONE-ACETAMINOPHEN 5-325 MG PO TABS: 1.0000 | ORAL_TABLET | ORAL | Status: DC | PRN

## 2013-07-17 NOTE — Progress Notes (Signed)
Patient ID: Sarah Horn, female   DOB: 11-Mar-1991, 22 y.o.   MRN: [id number]  Chief Complaint  Patient presents with  . Knee Pain    Left knee pain d/t injury 07/11/13    22 year old female status post anterior cruciate ligament reconstruction left knee in 2009 with patella tendon autograft she also a chondral lesions of her medial and lateral condyles but no meniscal tear presents after tripping over on level concrete felt something pop in her knee she fell. She went to the emergency room x-rays were negative. She complains of medial dull throbbing aching knee pain moderate to severe present for 6 days associated with swelling and loss of motion.  Review of systems negative  Exam vital signs are.vs Ht 5\' 5"  (1.651 m)  Wt 260 lb (117.935 kg)  BMI 43.27 kg/m2  LMP 06/22/2013  She does have some obesity but she is well-developed and well-nourished grooming and hygiene are normal. She is oriented to person place and time and her mood is pleasant. She is ambulatory with crutches partial weightbearing with the brace. Upper extremity show no abnormalities  Left knee has a anterior incision which is nontender there is a joint effusion and tenderness on the medial joint line and posterior aspect of the knee. Her range of motion is 0-90 and painful her graft feels stable. Muscle tone is normal skin is intact she does appear to have a positive McMurray sign good pulse distally normal sensation is noted as well.  Recommend MRI left knee for torn medial meniscus we can also evaluate the graft at that time. If the meniscus was torn we would proceed with arthroscopic surgery  She is to continue crutches weight-bear as tolerated continue ibuprofen and Vicodin or Norco for pain ice 3 times a day follow up after MRI.

## 2013-07-17 NOTE — Patient Instructions (Addendum)
MRI ordered Ice 3 times day for 20 minutes Crutches weight bear as tolerated

## 2013-07-17 NOTE — ED Provider Notes (Signed)
Medical screening examination/treatment/procedure(s) were performed by non-physician practitioner and as supervising physician I was immediately available for consultation/collaboration.    Christopher J. Pollina, MD 07/17/13 1525 

## 2013-07-18 ENCOUNTER — Telehealth: Payer: Self-pay | Admitting: *Deleted

## 2013-07-18 NOTE — Telephone Encounter (Signed)
No pre-cert required for any services per Delaney Meigs with [employer]. Scheduled for 07/23/13 5:45 pm at [facility] Follow up scheduled for 07/31/13 at 1:45 pm Patient aware of dates and times

## 2013-07-23 ENCOUNTER — Ambulatory Visit (HOSPITAL_COMMUNITY)
Admission: RE | Admit: 2013-07-23 | Discharge: 2013-07-23 | Disposition: A | Discharge status: Home / Self Care | Payer: PRIVATE HEALTH INSURANCE | Source: Ambulatory Visit | Attending: Orthopedic Surgery | Admitting: Orthopedic Surgery

## 2013-07-23 DIAGNOSIS — M76899 Other specified enthesopathies of unspecified lower limb, excluding foot: Secondary | ICD-10-CM | POA: Insufficient documentation

## 2013-07-23 DIAGNOSIS — S83509A Sprain of unspecified cruciate ligament of unspecified knee, initial encounter: Secondary | ICD-10-CM | POA: Insufficient documentation

## 2013-07-23 DIAGNOSIS — S8990XA Unspecified injury of unspecified lower leg, initial encounter: Secondary | ICD-10-CM | POA: Insufficient documentation

## 2013-07-23 DIAGNOSIS — X500XXA Overexertion from strenuous movement or load, initial encounter: Secondary | ICD-10-CM | POA: Insufficient documentation

## 2013-07-23 DIAGNOSIS — S83242A Other tear of medial meniscus, current injury, left knee, initial encounter: Secondary | ICD-10-CM

## 2013-07-23 DIAGNOSIS — M25569 Pain in unspecified knee: Secondary | ICD-10-CM | POA: Insufficient documentation

## 2013-07-23 DIAGNOSIS — S83419A Sprain of medial collateral ligament of unspecified knee, initial encounter: Secondary | ICD-10-CM | POA: Insufficient documentation

## 2013-07-31 ENCOUNTER — Other Ambulatory Visit: Payer: Self-pay | Admitting: *Deleted

## 2013-07-31 ENCOUNTER — Encounter: Payer: Self-pay | Admitting: Orthopedic Surgery

## 2013-07-31 ENCOUNTER — Ambulatory Visit (INDEPENDENT_AMBULATORY_CARE_PROVIDER_SITE_OTHER): Payer: PRIVATE HEALTH INSURANCE | Admitting: Orthopedic Surgery

## 2013-07-31 VITALS — BP 117/81 | Ht 65.0 in | Wt 260.0 lb

## 2013-07-31 DIAGNOSIS — IMO0002 Reserved for concepts with insufficient information to code with codable children: Secondary | ICD-10-CM

## 2013-07-31 NOTE — Patient Instructions (Addendum)
Surgery  Give [facility] note for 6 weeks     29880  Arthroscopic Procedure, Knee An arthroscopic procedure can find what is wrong with your knee. PROCEDURE Arthroscopy is a surgical technique that allows your orthopedic surgeon to diagnose and treat your knee injury with accuracy. They will look into your knee through a small instrument. This is almost like a small (pencil sized) telescope. Because arthroscopy affects your knee less than open knee surgery, you can anticipate a more rapid recovery. Taking an active role by following your caregiver's instructions will help with rapid and complete recovery. Use crutches, rest, elevation, ice, and knee exercises as instructed. The length of recovery depends on various factors including type of injury, age, physical condition, medical conditions, and your rehabilitation. Your knee is the joint between the large bones (femur and tibia) in your leg. Cartilage covers these bone ends which are smooth and slippery and allow your knee to bend and move smoothly. Two menisci, thick, semi-lunar shaped pads of cartilage which form a rim inside the joint, help absorb shock and stabilize your knee. Ligaments bind the bones together and support your knee joint. Muscles move the joint, help support your knee, and take stress off the joint itself. Because of this all programs and physical therapy to rehabilitate an injured or repaired knee require rebuilding and strengthening your muscles. AFTER THE PROCEDURE  After the procedure, you will be moved to a recovery area until most of the effects of the medication have worn off. Your caregiver will discuss the test results with you.  Only take over-the-counter or prescription medicines for pain, discomfort, or fever as directed by your caregiver. SEEK MEDICAL CARE IF:   You have increased bleeding from your wounds.  You see redness, swelling, or have increasing pain in your wounds.  You have pus coming from your  wound.  You have an oral temperature above 102 F (38.9 C).  You notice a bad smell coming from the wound or dressing.  You have severe pain with any motion of your knee. SEEK IMMEDIATE MEDICAL CARE IF:   You develop a rash.  You have difficulty breathing.  You have any allergic problems. Document Released: 10/15/2000 Document Revised: 01/10/2012 Document Reviewed: 05/08/2008 [facility] Patient Information 2014 [place], [place].

## 2013-07-31 NOTE — Progress Notes (Signed)
Patient ID: Sarah Horn, female   DOB: Mar 17, 1991, 22 y.o.   MRN: [id number] Chief Complaint  Patient presents with  . Results    MRI Results   Left knee pain  Review of systems negative no numbness or tingling knee feels unstable   The patient injured her left knee again and the MRI shows the following    IMPRESSION: 1. Complete tear of the ACL graft. 2. High-grade partial thickness tear of the fibular collateral ligament near its femoral attachment site. 3. Medial collateral ligament sprain and traumatic MCL and pes anserine bursitis. 4. Suspect plantaris tendon rupture and muscle tear along with partial thickness tearing of the medial gastroc muscle. 5. Medial and lateral meniscal tears as described above. 6. Anterior medial femoral condyle and lateral tibial bone contusions. 7. Progressive tricompartmental degenerative changes and moderate synovitis.   It is unclear the condition of her graft based on her exam and symptoms she still having medial knee pain does not have any lateral knee pain despite the fibular collateral ligaments appearance. So at this point we've decided to do an exam under anesthesia and arthroscopy of the knee clean up the meniscal issues assess the graft and ligament which looks to be torn on MRI. We've decided to go with rehabilitation of the knee after surgery.

## 2013-08-07 ENCOUNTER — Encounter (HOSPITAL_COMMUNITY): Payer: Self-pay | Admitting: Pharmacy Technician

## 2013-08-13 ENCOUNTER — Telehealth: Payer: Self-pay | Admitting: Orthopedic Surgery

## 2013-08-13 ENCOUNTER — Encounter (HOSPITAL_COMMUNITY): Payer: Self-pay

## 2013-08-13 ENCOUNTER — Encounter (HOSPITAL_COMMUNITY)
Admission: RE | Admit: 2013-08-13 | Discharge: 2013-08-13 | Disposition: A | Discharge status: Home / Self Care | Payer: PRIVATE HEALTH INSURANCE | Source: Ambulatory Visit | Attending: Orthopedic Surgery | Admitting: Orthopedic Surgery

## 2013-08-13 DIAGNOSIS — Z01812 Encounter for preprocedural laboratory examination: Secondary | ICD-10-CM | POA: Insufficient documentation

## 2013-08-13 LAB — HCG, SERUM, QUALITATIVE: Preg, Serum: NEGATIVE

## 2013-08-13 LAB — BASIC METABOLIC PANEL
CO2: 28 mEq/L (ref 19–32)
Calcium: 10 mg/dL (ref 8.4–10.5)
Chloride: 100 mEq/L (ref 96–112)
Glucose, Bld: 85 mg/dL (ref 70–99)
Potassium: 4.5 mEq/L (ref 3.5–5.1)
Sodium: 137 mEq/L (ref 135–145)

## 2013-08-13 LAB — HEMOGLOBIN AND HEMATOCRIT, BLOOD: HCT: 41.7 % (ref 36.0–46.0)

## 2013-08-13 MED ORDER — CHLORHEXIDINE GLUCONATE 4 % EX LIQD: 60.0000 mL | Freq: Once | CUTANEOUS | Status: DC

## 2013-08-13 NOTE — Patient Instructions (Signed)
Sarah Horn  08/13/2013   Your procedure is scheduled on:  08/17/13  Report to [facility] at 07:00 AM.  Call this number if you have problems the morning of surgery: [phone]   Remember:   Do not eat food or drink liquids after midnight.   Take these medicines the morning of surgery with A SIP OF WATER: None   Do not wear jewelry, make-up or nail polish.  Do not wear lotions, powders, or perfumes.   Do not shave 48 hours prior to surgery. Men may shave face and neck.  Do not bring valuables to the hospital.  [facility] is not responsible for any belongings or valuables.               Contacts, dentures or bridgework may not be worn into surgery.  Leave suitcase in the car. After surgery it may be brought to your room.  For patients admitted to the hospital, discharge time is determined by your treatment team.               Patients discharged the day of surgery will not be allowed to drive home.    Special Instructions: Shower using CHG 2 nights before surgery and the night before surgery.  If you shower the day of surgery use CHG.  Use special wash - you have one bottle of CHG for all showers.  You should use approximately 1/3 of the bottle for each shower.   Please read over the following fact sheets that you were given: Pain Booklet, Surgical Site Infection Prevention and Anesthesia Post-op Instructions    Arthroscopic Procedure, Knee An arthroscopic procedure can find what is wrong with your knee. PROCEDURE Arthroscopy is a surgical technique that allows your orthopedic surgeon to diagnose and treat your knee injury with accuracy. They will look into your knee through a small instrument. This is almost like a small (pencil sized) telescope. Because arthroscopy affects your knee less than open knee surgery, you can anticipate a more rapid recovery. Taking an active role by following your caregiver's instructions will help with rapid and complete recovery. Use crutches,  rest, elevation, ice, and knee exercises as instructed. The length of recovery depends on various factors including type of injury, age, physical condition, medical conditions, and your rehabilitation. Your knee is the joint between the large bones (femur and tibia) in your leg. Cartilage covers these bone ends which are smooth and slippery and allow your knee to bend and move smoothly. Two menisci, thick, semi-lunar shaped pads of cartilage which form a rim inside the joint, help absorb shock and stabilize your knee. Ligaments bind the bones together and support your knee joint. Muscles move the joint, help support your knee, and take stress off the joint itself. Because of this all programs and physical therapy to rehabilitate an injured or repaired knee require rebuilding and strengthening your muscles. AFTER THE PROCEDURE  After the procedure, you will be moved to a recovery area until most of the effects of the medication have worn off. Your caregiver will discuss the test results with you.  Only take over-the-counter or prescription medicines for pain, discomfort, or fever as directed by your caregiver. SEEK MEDICAL CARE IF:   You have increased bleeding from your wounds.  You see redness, swelling, or have increasing pain in your wounds.  You have pus coming from your wound.  You have an oral temperature above 102 F (38.9 C).  You notice a bad smell coming from  the wound or dressing.  You have severe pain with any motion of your knee. SEEK IMMEDIATE MEDICAL CARE IF:   You develop a rash.  You have difficulty breathing.  You have any allergic problems. Document Released: 10/15/2000 Document Revised: 01/10/2012 Document Reviewed: 05/08/2008 [facility] Patient Information 2014 [place], [place].    PATIENT INSTRUCTIONS POST-ANESTHESIA  IMMEDIATELY FOLLOWING SURGERY:  Do not drive or operate machinery for the first twenty four hours after surgery.  Do not make any important  decisions for twenty four hours after surgery or while taking narcotic pain medications or sedatives.  If you develop intractable nausea and vomiting or a severe headache please notify your doctor immediately.  FOLLOW-UP:  Please make an appointment with your surgeon as instructed. You do not need to follow up with anesthesia unless specifically instructed to do so.  WOUND CARE INSTRUCTIONS (if applicable):  Keep a dry clean dressing on the anesthesia/puncture wound site if there is drainage.  Once the wound has quit draining you may leave it open to air.  Generally you should leave the bandage intact for twenty four hours unless there is drainage.  If the epidural site drains for more than 36-48 hours please call the anesthesia department.  QUESTIONS?:  Please feel free to call your physician or the hospital operator if you have any questions, and they will be happy to assist you.

## 2013-08-13 NOTE — Telephone Encounter (Signed)
Regarding out-patient surgery scheduled 08/17/13, CPT 29880 - called back to insurer, [employer] ph# [phone] (initially I had been advised per Delaney Meigs at insurer that benefits + eligibility need to be verified weekly due to type of plan and also when premium is taken it out of paycheck).  Therefore, called back Monday, 08/13/13, and verified benefits and coverage- active.  Per Newton Pigg,  this plan is a fixed medical indemnity plan with no coverage on out-patient surgery.  Plan pays a total of $1000 per year, including office visits (@$50 per visit) (Xrays @$50 per visit), up to $1000 maximum benefit pay out per year.  Reference # [id number], 4:58p.m.  I called back to patient to relay; reached voice mail at cell ph#[phone]; left message to return call.

## 2013-08-16 NOTE — H&P (Addendum)
  Chief Complaint   Patient presents with   .  Knee Pain       Left knee pain d/t injury 07/11/13     22 year old female status post anterior cruciate ligament reconstruction left knee in 2009 with patella tendon autograft she also a chondral lesions of her medial and lateral condyles but no meniscal tear presents after tripping over on level concrete felt something pop in her knee she fell. She went to the emergency room x-rays were negative. She complains of medial dull throbbing aching knee pain moderate to severe present for 6 days associated with swelling and loss of motion.  Review of systems negative  Past Medical History  Diagnosis Date  . Irregular menstrual cycle    Past Surgical History  Procedure Laterality Date  . Anterior cruciate ligament repair      left, [facility]; [facility] B[facility]  . Breast biopsy  06/30/2011    Procedure: BREAST BIOPSY;  Surgeon: Fabio Bering;  Location: [facility];  Service: General;  Laterality: Left;   Family History  Problem Relation Age of Onset  . Anesthesia problems Neg Hx    History  Substance Use Topics  . Smoking status: Never Smoker   . Smokeless tobacco: Not on file  . Alcohol Use: No     Exam vital signs are.vs Ht 5\' 5"  (1.651 m)  Wt 260 lb (117.935 kg)  BMI 43.27 kg/m2  LMP 06/22/2013  She does have some obesity but she is well-developed and well-nourished grooming and hygiene are normal. She is oriented to person place and time and her mood is pleasant. She is ambulatory with crutches partial weightbearing with the brace. Upper extremity show no abnormalities  Left knee has a anterior incision which is nontender there is a joint effusion and tenderness on the medial joint line and posterior aspect of the knee. Her range of motion is 0-90 and painful her graft feels stable. Muscle tone is normal skin is intact she does appear to have a positive McMurray sign good pulse distally normal sensation is noted as well.    IMPRESSION: 1.  Complete tear of the ACL graft. 2. High-grade partial thickness tear of the fibular collateral ligament near its femoral attachment site. 3. Medial collateral ligament sprain and traumatic MCL and pes anserine bursitis. 4. Suspect plantaris tendon rupture and muscle tear along with partial thickness tearing of the medial gastroc muscle. 5. Medial and lateral meniscal tears as described above. 6. Anterior medial femoral condyle and lateral tibial bone contusions. 7. Progressive tricompartmental degenerative changes and moderate synovitis.  At this point the plan is to arthroscopically evaluate the left knee along with exam under anesthesia prior to arthroscopy and remove the torn meniscal tissue debris the knee and then put the patient in rehabilitation

## 2013-08-17 ENCOUNTER — Ambulatory Visit (HOSPITAL_COMMUNITY)
Admission: RE | Admit: 2013-08-17 | Discharge: 2013-08-17 | Disposition: A | Discharge status: Home / Self Care | Payer: PRIVATE HEALTH INSURANCE | Source: Ambulatory Visit | Attending: Orthopedic Surgery | Admitting: Orthopedic Surgery

## 2013-08-17 ENCOUNTER — Encounter (HOSPITAL_COMMUNITY): Payer: Self-pay | Admitting: *Deleted

## 2013-08-17 ENCOUNTER — Encounter (HOSPITAL_COMMUNITY)
Admission: RE | Disposition: A | Discharge status: Home / Self Care | Payer: Self-pay | Source: Ambulatory Visit | Attending: Orthopedic Surgery

## 2013-08-17 ENCOUNTER — Ambulatory Visit (HOSPITAL_COMMUNITY): Payer: PRIVATE HEALTH INSURANCE | Admitting: Anesthesiology

## 2013-08-17 ENCOUNTER — Encounter (HOSPITAL_COMMUNITY): Payer: PRIVATE HEALTH INSURANCE | Admitting: Anesthesiology

## 2013-08-17 DIAGNOSIS — W010XXA Fall on same level from slipping, tripping and stumbling without subsequent striking against object, initial encounter: Secondary | ICD-10-CM | POA: Insufficient documentation

## 2013-08-17 DIAGNOSIS — IMO0002 Reserved for concepts with insufficient information to code with codable children: Secondary | ICD-10-CM

## 2013-08-17 DIAGNOSIS — M23301 Other meniscus derangements, unspecified lateral meniscus, left knee: Secondary | ICD-10-CM

## 2013-08-17 DIAGNOSIS — S83502S Sprain of unspecified cruciate ligament of left knee, sequela: Secondary | ICD-10-CM

## 2013-08-17 DIAGNOSIS — S83289A Other tear of lateral meniscus, current injury, unspecified knee, initial encounter: Secondary | ICD-10-CM | POA: Insufficient documentation

## 2013-08-17 HISTORY — PX: EXAMINATION UNDER ANESTHESIA: SHX1540

## 2013-08-17 HISTORY — PX: KNEE ARTHROSCOPY WITH LATERAL MENISECTOMY: SHX6193

## 2013-08-17 SURGERY — ARTHROSCOPY, KNEE, WITH LATERAL MENISCECTOMY
Anesthesia: General | Site: Knee | Laterality: Left | Wound class: Clean

## 2013-08-17 MED ORDER — LIDOCAINE HCL 1 % IJ SOLN
INTRAMUSCULAR | Status: DC | PRN
  Administered 2013-08-17: 50 mg via INTRADERMAL

## 2013-08-17 MED ORDER — MIDAZOLAM HCL 2 MG/2ML IJ SOLN
1.0000 mg | INTRAMUSCULAR | Status: DC | PRN
  Administered 2013-08-17 (×2): 2 mg via INTRAVENOUS
  Filled 2013-08-17: qty 2

## 2013-08-17 MED ORDER — CEFAZOLIN SODIUM-DEXTROSE 2-3 GM-% IV SOLR
2.0000 g | INTRAVENOUS | Status: AC
  Administered 2013-08-17: 2 g via INTRAVENOUS
  Filled 2013-08-17: qty 50

## 2013-08-17 MED ORDER — PROPOFOL 10 MG/ML IV BOLUS
INTRAVENOUS | Status: DC | PRN
  Administered 2013-08-17: 180 mg via INTRAVENOUS

## 2013-08-17 MED ORDER — PROMETHAZINE HCL 12.5 MG PO TABS: 12.5000 mg | ORAL_TABLET | Freq: Four times a day (QID) | ORAL | Status: DC | PRN

## 2013-08-17 MED ORDER — SODIUM CHLORIDE 0.9 % IR SOLN
Status: DC | PRN
  Administered 2013-08-17 (×5)

## 2013-08-17 MED ORDER — PROPOFOL 10 MG/ML IV BOLUS
INTRAVENOUS | Status: AC
  Filled 2013-08-17: qty 20

## 2013-08-17 MED ORDER — SODIUM CHLORIDE 0.9 % IR SOLN
Status: DC | PRN
  Administered 2013-08-17: 1000 mL

## 2013-08-17 MED ORDER — EPINEPHRINE HCL 1 MG/ML IJ SOLN
INTRAMUSCULAR | Status: AC
  Filled 2013-08-17: qty 5

## 2013-08-17 MED ORDER — MIDAZOLAM HCL 5 MG/5ML IJ SOLN
INTRAMUSCULAR | Status: DC | PRN
  Administered 2013-08-17: 2 mg via INTRAVENOUS

## 2013-08-17 MED ORDER — FENTANYL CITRATE 0.05 MG/ML IJ SOLN
INTRAMUSCULAR | Status: AC
  Filled 2013-08-17: qty 2

## 2013-08-17 MED ORDER — LACTATED RINGERS IV SOLN
INTRAVENOUS | Status: DC
  Administered 2013-08-17: 1000 mL via INTRAVENOUS

## 2013-08-17 MED ORDER — MIDAZOLAM HCL 2 MG/2ML IJ SOLN
INTRAMUSCULAR | Status: AC
  Filled 2013-08-17: qty 2

## 2013-08-17 MED ORDER — BUPIVACAINE-EPINEPHRINE PF 0.5-1:200000 % IJ SOLN
INTRAMUSCULAR | Status: AC
  Filled 2013-08-17: qty 20

## 2013-08-17 MED ORDER — BUPIVACAINE-EPINEPHRINE PF 0.5-1:200000 % IJ SOLN
INTRAMUSCULAR | Status: DC | PRN
  Administered 2013-08-17: 60 mL

## 2013-08-17 MED ORDER — HYDROCODONE-ACETAMINOPHEN 10-325 MG PO TABS: 1.0000 | ORAL_TABLET | ORAL | Status: DC | PRN

## 2013-08-17 MED ORDER — LIDOCAINE HCL (PF) 1 % IJ SOLN
INTRAMUSCULAR | Status: AC
  Filled 2013-08-17: qty 5

## 2013-08-17 MED ORDER — FENTANYL CITRATE 0.05 MG/ML IJ SOLN
INTRAMUSCULAR | Status: DC | PRN
  Administered 2013-08-17: 50 ug via INTRAVENOUS
  Administered 2013-08-17: 25 ug via INTRAVENOUS
  Administered 2013-08-17: 50 ug via INTRAVENOUS
  Administered 2013-08-17: 75 ug via INTRAVENOUS

## 2013-08-17 MED ORDER — FENTANYL CITRATE 0.05 MG/ML IJ SOLN
25.0000 ug | INTRAMUSCULAR | Status: AC
  Administered 2013-08-17 (×2): 25 ug via INTRAVENOUS

## 2013-08-17 MED ORDER — ONDANSETRON HCL 4 MG/2ML IJ SOLN: 4.0000 mg | Freq: Once | INTRAMUSCULAR | Status: DC | PRN

## 2013-08-17 MED ORDER — FENTANYL CITRATE 0.05 MG/ML IJ SOLN: 25.0000 ug | INTRAMUSCULAR | Status: DC | PRN

## 2013-08-17 SURGICAL SUPPLY — 60 items
ARTHROWAND PARAGON T2 (SURGICAL WAND)
BAG HAMPER (MISCELLANEOUS) ×3 IMPLANT
BANDAGE ELASTIC 6 VELCRO NS (GAUZE/BANDAGES/DRESSINGS) ×3 IMPLANT
BLADE AGGRESSIVE PLUS 4.0 (BLADE) ×3 IMPLANT
BLADE SURG SZ11 CARB STEEL (BLADE) ×3 IMPLANT
CHLORAPREP W/TINT 26ML (MISCELLANEOUS) ×6 IMPLANT
CLOTH BEACON ORANGE TIMEOUT ST (SAFETY) ×3 IMPLANT
COOLER CRYO IC GRAV AND TUBE (ORTHOPEDIC SUPPLIES) ×3 IMPLANT
COVER PROBE W GEL 5X96 (DRAPES) ×1 IMPLANT
CUFF CRYO KNEE LG 20X31 COOLER (ORTHOPEDIC SUPPLIES) ×2 IMPLANT
CUFF CRYO KNEE18X23 MED (MISCELLANEOUS) IMPLANT
CUFF TOURNIQUET SINGLE 34IN LL (TOURNIQUET CUFF) ×2 IMPLANT
CUFF TOURNIQUET SINGLE 44IN (TOURNIQUET CUFF) IMPLANT
CUTTER ANGLED DBL BITE 4.5 (BURR) ×3 IMPLANT
DECANTER SPIKE VIAL GLASS SM (MISCELLANEOUS) ×6 IMPLANT
ELECT REM PT RETURN 9FT ADLT (ELECTROSURGICAL)
ELECTRODE REM PT RTRN 9FT ADLT (ELECTROSURGICAL) IMPLANT
GAUZE SPONGE 4X4 16PLY XRAY LF (GAUZE/BANDAGES/DRESSINGS) ×3 IMPLANT
GAUZE XEROFORM 5X9 LF (GAUZE/BANDAGES/DRESSINGS) ×3 IMPLANT
GLOVE BIOGEL PI IND STRL 7.0 (GLOVE) ×1 IMPLANT
GLOVE BIOGEL PI INDICATOR 7.0 (GLOVE) ×1
GLOVE ECLIPSE 6.5 STRL STRAW (GLOVE) ×2 IMPLANT
GLOVE EXAM NITRILE MD LF STRL (GLOVE) ×2 IMPLANT
GLOVE SKINSENSE NS SZ8.0 LF (GLOVE) ×1
GLOVE SKINSENSE STRL SZ8.0 LF (GLOVE) ×2 IMPLANT
GLOVE SS N UNI LF 8.5 STRL (GLOVE) ×3 IMPLANT
GOWN PREVENTION PLUS XLARGE (GOWN DISPOSABLE) ×3 IMPLANT
GOWN STRL REIN XL XLG (GOWN DISPOSABLE) ×9 IMPLANT
HLDR LEG FOAM (MISCELLANEOUS) ×2 IMPLANT
IV NS IRRIG 3000ML ARTHROMATIC (IV SOLUTION) ×12 IMPLANT
KIT BLADEGUARD II DBL (SET/KITS/TRAYS/PACK) ×3 IMPLANT
KIT ROOM TURNOVER AP CYSTO (KITS) ×3 IMPLANT
LEG HOLDER FOAM (MISCELLANEOUS) ×1
MANIFOLD NEPTUNE II (INSTRUMENTS) ×3 IMPLANT
MARKER SKIN DUAL TIP RULER LAB (MISCELLANEOUS) ×3 IMPLANT
NDL HYPO 18GX1.5 BLUNT FILL (NEEDLE) ×1 IMPLANT
NDL HYPO 21X1.5 SAFETY (NEEDLE) ×1 IMPLANT
NDL SPNL 18GX3.5 QUINCKE PK (NEEDLE) ×1 IMPLANT
NEEDLE HYPO 18GX1.5 BLUNT FILL (NEEDLE) ×3 IMPLANT
NEEDLE HYPO 21X1.5 SAFETY (NEEDLE) ×3 IMPLANT
NEEDLE SPNL 18GX3.5 QUINCKE PK (NEEDLE) ×3 IMPLANT
NS IRRIG 1000ML POUR BTL (IV SOLUTION) ×3 IMPLANT
PACK ARTHRO LIMB DRAPE STRL (MISCELLANEOUS) ×3 IMPLANT
PAD ABD 5X9 TENDERSORB (GAUZE/BANDAGES/DRESSINGS) ×3 IMPLANT
PAD ARMBOARD 7.5X6 YLW CONV (MISCELLANEOUS) ×3 IMPLANT
PADDING CAST COTTON 6X4 STRL (CAST SUPPLIES) ×3 IMPLANT
PADDING WEBRIL 6 STERILE (GAUZE/BANDAGES/DRESSINGS) ×2 IMPLANT
SET ARTHROSCOPY INST (INSTRUMENTS) ×3 IMPLANT
SET ARTHROSCOPY PUMP TUBE (IRRIGATION / IRRIGATOR) ×3 IMPLANT
SET BASIN LINEN APH (SET/KITS/TRAYS/PACK) ×3 IMPLANT
SOLUTION ANTI FOG 6CC (MISCELLANEOUS) ×2 IMPLANT
SPONGE GAUZE 4X4 12PLY (GAUZE/BANDAGES/DRESSINGS) ×3 IMPLANT
SUT ETHILON 3 0 FSL (SUTURE) ×3 IMPLANT
SYR 30ML LL (SYRINGE) ×3 IMPLANT
SYRINGE 10CC LL (SYRINGE) ×3 IMPLANT
WAND 50 DEG COVAC W/CORD (SURGICAL WAND) IMPLANT
WAND 90 DEG TURBOVAC W/CORD (SURGICAL WAND) IMPLANT
WAND ARTHRO PARAGON T2 (SURGICAL WAND) IMPLANT
WATER STERILE IRR 1000ML POUR (IV SOLUTION) ×3 IMPLANT
YANKAUER SUCT BULB TIP 10FT TU (MISCELLANEOUS) ×9 IMPLANT

## 2013-08-17 NOTE — Transfer of Care (Signed)
Immediate Anesthesia Transfer of Care Note  Patient: Sarah Horn  Procedure(s) Performed: Procedure(s): KNEE ARTHROSCOPY WITH PARTIAL LATERAL MENISECTOMY, LIMITED DEBRIDEMENT (Left) EXAM UNDER ANESTHESIA LEFT KNEE (Left)  Patient Location: PACU  Anesthesia Type:General  Level of Consciousness: awake, alert  and patient cooperative  Airway & Oxygen Therapy: Patient Spontanous Breathing and Patient connected to face mask oxygen  Post-op Assessment: Report given to PACU RN, Post -op Vital signs reviewed and stable and Patient moving all extremities  Post vital signs: Reviewed and stable  Complications: No apparent anesthesia complications

## 2013-08-17 NOTE — Interval H&P Note (Signed)
History and Physical Interval Note:  08/17/2013 8:28 AM  Sarah Horn  has presented today for surgery, with the diagnosis of left medial meniscal and lateral tear  The various methods of treatment have been discussed with the patient and family. After consideration of risks, benefits and other options for treatment, the patient has consented to  Procedure(s): KNEE ARTHROSCOPY WITH MEDIAL MENISECTOMY (Left) KNEE ARTHROSCOPY WITH LATERAL MENISECTOMY (Left) as a surgical intervention .  The patient's history has been reviewed, patient examined, no change in status, stable for surgery.  I have reviewed the patient's chart and labs.  Questions were answered to the patient's satisfaction.     Fuller Canada

## 2013-08-17 NOTE — Op Note (Addendum)
08/17/2013  9:48 AM  PATIENT:  Sarah Horn  22 y.o. female  PRE-OPERATIVE DIAGNOSIS:  left medial meniscal and lateral tear  POST-OPERATIVE DIAGNOSIS:  left  lateral meniscus tear, anterior cruciate ligament graft rupture  PROCEDURE:  Arthroscopy left knee, partial lateral menisectomy and limited debridement , Chondroplasties  SURGEON:  Surgeon(s) and Role:    * Vickki Hearing, MD - Primary  PHYSICIAN ASSISTANT:   ASSISTANTS: none   ANESTHESIA:   general  EBL:  Total I/O In: 500 [I.V.:500] Out: -   BLOOD ADMINISTERED:none  DRAINS: none   LOCAL MEDICATIONS USED:  MARCAINE    and Amount: 60 with epi  ml  SPECIMEN:  No Specimen  DISPOSITION OF SPECIMEN:  N/A  COUNTS:  YES  TOURNIQUET:    DICTATION: .Dragon Dictation  PLAN OF CARE: Discharge to home after PACU  PATIENT DISPOSITION:  PACU - hemodynamically stable.   Delay start of Pharmacological VTE agent (>24hrs) due to surgical blood loss or risk of bleeding: not applicable  The cameras and video equipment gave poor visualization of the joint even after switching cameras. Initial views were adequate to complete the procedure   Patient was identified in the preoperative holding area and the left knee was confirmed as a surgical site and marked. She was taken to the operating room for general anesthesia. She was given IV antibiotics based on her weight 118 kg and no known drug allergies.  After adequate intubation the knee was examined. A brief time out was taken. The knee had full range of motion hyperextension. Showed grade 1 Lachman grade 1 anterior drawer grade 1 pivot shift collateral ligaments were stable at 0 and 30 PCL was stable.  The knee was then prepped and draped sterilely a second timeout was taken.  Standard portals were established medial and lateral. Scope was placed into the joint through the lateral portal. After diagnostic arthroscopy a probe was placed on the medial side and into the  joint and it was obvious that the anterior cruciate ligament graft was completely ruptured the PCL is intact. There was a small tear of lateral meniscus at the posterior horn as well as a normal although previously resected medial meniscus. A grade 1 and 2 chondral changes on the medial femoral condyle and patella medial facet this was resected with a shaver. Limited debridement was performed to remove the anterior cruciate ligament graft tissue and synovium. Chondroplasties were performed as stated.  At this point visualization became poor secondary to equipment issues. A new camera light cord and scope equipment was requested and then placed into the joint initial visualization was good and allowed adequate completion of the case but then became poor again.  The knee was irrigated on the wash mode and closed with 3-0 nylon sutures with injection of a total of 60 cc of Marcaine with epinephrine.

## 2013-08-17 NOTE — Anesthesia Postprocedure Evaluation (Signed)
  Anesthesia Post-op Note  Patient: Sarah Horn  Procedure(s) Performed: Procedure(s): KNEE ARTHROSCOPY WITH PARTIAL LATERAL MENISECTOMY, LIMITED DEBRIDEMENT (Left) EXAM UNDER ANESTHESIA LEFT KNEE (Left)  Patient Location: PACU  Anesthesia Type:General  Level of Consciousness: awake, alert , oriented and patient cooperative  Airway and Oxygen Therapy: Patient Spontanous Breathing and Patient connected to face mask oxygen  Post-op Pain: 3 /10, mild  Post-op Assessment: Post-op Vital signs reviewed, Patient's Cardiovascular Status Stable, Respiratory Function Stable, Patent Airway and Pain level controlled  Post-op Vital Signs: Reviewed and stable  Complications: No apparent anesthesia complications

## 2013-08-17 NOTE — Anesthesia Preprocedure Evaluation (Signed)
Anesthesia Evaluation  Patient identified by MRN, date of birth, ID band Patient awake    Reviewed: Allergy & Precautions, H&P , NPO status , Patient's Chart, lab work & pertinent test results, reviewed documented beta blocker date and time   History of Anesthesia Complications Negative for: history of anesthetic complications  Airway Mallampati: II  Neck ROM: Full    Dental  (+) Teeth Intact   Pulmonary neg pulmonary ROS,    Pulmonary exam normal       Cardiovascular negative cardio ROS  Rhythm:Regular Rate:Normal     Neuro/Psych  Neuromuscular disease    GI/Hepatic negative GI ROS,   Endo/Other  Morbid obesity  Renal/GU      Musculoskeletal   Abdominal   Peds  Hematology   Anesthesia Other Findings   Reproductive/Obstetrics                           Anesthesia Physical Anesthesia Plan  ASA: II  Anesthesia Plan: General   Post-op Pain Management:    Induction: Intravenous  Airway Management Planned: LMA  Additional Equipment:   Intra-op Plan:   Post-operative Plan: Extubation in OR  Informed Consent: I have reviewed the patients History and Physical, chart, labs and discussed the procedure including the risks, benefits and alternatives for the proposed anesthesia with the patient or authorized representative who has indicated his/her understanding and acceptance.     Plan Discussed with:   Anesthesia Plan Comments:         Anesthesia Quick Evaluation

## 2013-08-17 NOTE — Brief Op Note (Addendum)
08/17/2013  9:48 AM  PATIENT:  Sarah Horn  22 y.o. female  PRE-OPERATIVE DIAGNOSIS:  left medial meniscal and lateral tear  POST-OPERATIVE DIAGNOSIS:   left lateral meniscal tear and anterior cruciate ligament graft rupture  PROCEDURE:  Arthroscopy left knee, partial lateral menisectomy and limited debridement, chondroplasties  SURGEON:  Surgeon(s) and Role:    * Vickki Hearing, MD - Primary  PHYSICIAN ASSISTANT:   ASSISTANTS: none    ANESTHESIA:   general  EBL:  Total I/O In: 500 [I.V.:500] Out: -   BLOOD ADMINISTERED:none  DRAINS: none   LOCAL MEDICATIONS USED:  MARCAINE    and Amount: 60 with epi  ml  SPECIMEN:  No Specimen  DISPOSITION OF SPECIMEN:  N/A  COUNTS:  YES  TOURNIQUET:    DICTATION: .Dragon Dictation  PLAN OF CARE: Discharge to home after PACU  PATIENT DISPOSITION:  PACU - hemodynamically stable.   Delay start of Pharmacological VTE agent (>24hrs) due to surgical blood loss or risk of bleeding: not applicable  The cameras and video equipment gave poor visualization of the joint even after switching cameras. Initial views were adequate to complete the procedure   Patient was identified in the preoperative holding area and the left knee was confirmed as a surgical site and marked. She was taken to the operating room for general anesthesia. She was given IV antibiotics based on her weight 118 kg and no known drug allergies.  After adequate intubation the knee was examined. A brief time out was taken. The knee had full range of motion hyperextension. Showed grade 1 Lachman grade 1 anterior drawer grade 1 pivot shift collateral ligaments were stable at 0 and 30 PCL was stable.  The knee was then prepped and draped sterilely a second timeout was taken.  Standard portals were established medial and lateral. Scope was placed into the joint through the lateral portal. After diagnostic arthroscopy a probe was placed on the medial side and into  the joint and it was obvious that the anterior cruciate ligament graft was completely ruptured the PCL is intact. There was a small tear of lateral meniscus at the posterior horn as well as a normal although previously resected medial meniscus. A grade 1 and 2 chondral changes on the medial femoral condyle and patella medial facet this was resected with a shaver. Limited debridement was performed to remove the anterior cruciate ligament graft tissue and synovium. Chondroplasties were performed as stated.  At this point visualization became poor secondary to equipment issues. A new camera light cord and scope equipment was requested and then placed into the joint initial visualization was good and allowed adequate completion of the case but then became poor again.  The knee was irrigated on the wash mode and closed with 3-0 nylon sutures with injection of a total of 60 cc of Marcaine with epinephrine.

## 2013-08-17 NOTE — Anesthesia Procedure Notes (Signed)
Procedure Name: LMA Insertion Date/Time: 08/17/2013 8:52 AM Performed by: Despina Hidden Pre-anesthesia Checklist: Emergency Drugs available, Patient identified, Suction available and Patient being monitored Patient Re-evaluated:Patient Re-evaluated prior to inductionOxygen Delivery Method: Circle system utilized Preoxygenation: Pre-oxygenation with 100% oxygen Intubation Type: IV induction Ventilation: Mask ventilation without difficulty LMA: LMA inserted LMA Size: 3.0 Grade View: Grade I Tube type: Oral Number of attempts: 1 Placement Confirmation: breath sounds checked- equal and bilateral and positive ETCO2 Tube secured with: Tape Dental Injury: Teeth and Oropharynx as per pre-operative assessment

## 2013-08-20 ENCOUNTER — Encounter: Payer: Self-pay | Admitting: Orthopedic Surgery

## 2013-08-20 ENCOUNTER — Ambulatory Visit (INDEPENDENT_AMBULATORY_CARE_PROVIDER_SITE_OTHER): Payer: Self-pay | Admitting: Orthopedic Surgery

## 2013-08-20 VITALS — BP 127/82 | Ht 65.0 in | Wt 260.0 lb

## 2013-08-20 DIAGNOSIS — S83282A Other tear of lateral meniscus, current injury, left knee, initial encounter: Secondary | ICD-10-CM | POA: Insufficient documentation

## 2013-08-20 DIAGNOSIS — IMO0002 Reserved for concepts with insufficient information to code with codable children: Secondary | ICD-10-CM

## 2013-08-20 DIAGNOSIS — S83502S Sprain of unspecified cruciate ligament of left knee, sequela: Secondary | ICD-10-CM

## 2013-08-20 DIAGNOSIS — Z9889 Other specified postprocedural states: Secondary | ICD-10-CM

## 2013-08-20 DIAGNOSIS — S83289A Other tear of lateral meniscus, current injury, unspecified knee, initial encounter: Secondary | ICD-10-CM

## 2013-08-20 NOTE — Patient Instructions (Signed)
Call to arrange therapy 

## 2013-08-20 NOTE — Progress Notes (Signed)
Patient ID: Sarah Horn, female   DOB: 04-18-1991, 22 y.o.   MRN: [id number]  Chief Complaint  Patient presents with  . Follow-up    Post op 1 SALK DOS 08/17/13    Blood pressure 127/82, height 5\' 5"  (1.651 m), weight 260 lb (117.935 kg), last menstrual period 07/16/2013.  The knee had full range of motion hyperextension. Showed grade 1 Lachman grade 1 anterior drawer grade 1 pivot shift collateral ligaments were stable at 0 and 30 PCL was stable.  After diagnostic arthroscopy a probe was placed on the medial side and into the joint and it was obvious that the anterior cruciate ligament graft was completely ruptured the PCL is intact. There was a small tear of lateral meniscus at the posterior horn as well as a normal although previously resected medial meniscus. A grade 1 and 2 chondral changes on the medial femoral condyle and patella medial facet this was resected with a shaver. Limited debridement was performed to remove the anterior cruciate ligament graft tissue and synovium. Chondroplasties were performed as stated.  PRE-OPERATIVE DIAGNOSIS:  left medial meniscal and lateral tear  POST-OPERATIVE DIAGNOSIS:  left  lateral meniscus tear, anterior cruciate ligament graft rupture  PROCEDURE:  Arthroscopy left knee, partial lateral menisectomy and limited debridement , Chondroplasties   SURGEON:  Surgeon(s) and Role:    * Vickki Hearing, MD - Primary  So basically we're dealing with a re\re tear of the anterior cruciate ligament with a lateral meniscal tear some arthritic changes requiring some mild chondroplasties and partial lateral meniscectomy for lateral meniscal tear  The patient has limited insurance and will basically have to rehabilitation her knee herself  I wanted to come back in 3 weeks to recheck her progress.  Today the knee looks good minimal swelling she is actually a hyperextended probably 10 or 15 from ligament laxity her knee today is in full extension  she's exercising as recommended  She is weightbearing as tolerated.

## 2013-08-28 ENCOUNTER — Ambulatory Visit (HOSPITAL_COMMUNITY)
Admission: RE | Admit: 2013-08-28 | Discharge: 2013-08-28 | Disposition: A | Discharge status: Home / Self Care | Payer: PRIVATE HEALTH INSURANCE | Source: Ambulatory Visit | Attending: Orthopedic Surgery | Admitting: Orthopedic Surgery

## 2013-08-28 DIAGNOSIS — M25569 Pain in unspecified knee: Secondary | ICD-10-CM | POA: Insufficient documentation

## 2013-08-28 DIAGNOSIS — IMO0001 Reserved for inherently not codable concepts without codable children: Secondary | ICD-10-CM | POA: Insufficient documentation

## 2013-08-28 DIAGNOSIS — R269 Unspecified abnormalities of gait and mobility: Secondary | ICD-10-CM | POA: Insufficient documentation

## 2013-08-28 NOTE — Evaluation (Signed)
Physical Therapy Evaluation  Patient Details  Name: Sarah Horn MRN: [id number] Date of Birth: 1990/11/16  Today's Date: 08/28/2013 Time: 6295-2841 PT Time Calculation (min): 35 min              Visit#: 1 of 4  Re-eval: 09/27/13 Assessment Diagnosis: Lt knee scope Surgical Date: 08/20/13 Next MD Visit: Dr. Romeo Apple - 09/11/13 Prior Therapy: 6 years ago  Past Medical History:  Past Medical History  Diagnosis Date  . Irregular menstrual cycle    Past Surgical History:  Past Surgical History  Procedure Laterality Date  . Anterior cruciate ligament repair      left, [facility]; [place]  . Breast biopsy  06/30/2011    Procedure: BREAST BIOPSY;  Surgeon: Fabio Bering;  Location: [facility];  Service: General;  Laterality: Left;  . Knee arthroscopy with lateral menisectomy Left 08/17/2013    Procedure: KNEE ARTHROSCOPY WITH PARTIAL LATERAL MENISECTOMY, LIMITED DEBRIDEMENT;  Surgeon: Vickki Hearing, MD;  Location: [facility];  Service: Orthopedics;  Laterality: Left;  . Examination under anesthesia Left 08/17/2013    Procedure: EXAM UNDER ANESTHESIA LEFT KNEE;  Surgeon: Vickki Hearing, MD;  Location: [facility];  Service: Orthopedics;  Laterality: Left;    Subjective Symptoms/Limitations Symptoms: Pt is a 22 year old female referred to PT s/p Lt knee scope with a hx of Lt ACL repair.  She reports that she is currently working out at the gym for 1 hour a day 3 times a week and is working on the elliptical only.  She reports she has not had any pain since the surgery.  Her greatest difficulty is with squating and lunging activities.  She is worried about returning to running and jogging to the [employer].  Pain Assessment Currently in Pain?: No/denies  Balance Screening Balance Screen Has the patient fallen in the past 6 months: No Has the patient had a decrease in activity level because of a fear of falling? : No Is the patient reluctant to leave their home because of a fear of  falling? : No  Prior Functioning  Prior Function Vocation: Student Vocation Requirements: [employer] Comments: Going to the gym 3x/week for 1 hour on the elliptical  Cognition/Observation Cognition Orientation Level: Oriented X4 Observation/Other Assessments Observations: lunge walking wtihout increased pain noted, has significant stability to hips and knees  Sensation/Coordination/Flexibility/Functional Tests Functional Tests Functional Tests: FOTO: 99%/1%  Assessment RLE Strength RLE Overall Strength Comments: 1 rep max: knee flexion: 82.5lbs (hamstring setting #4 ; knee extension: 95lbs LLE Strength LLE Overall Strength Comments: 1 rep max: knee flexion: 97.5lbs (hamstring setting #4 ; knee extension: 95lbs Palpation Palpation: unable to palpate tenderness to Lt knee.  Decreased patellar mobility  Mobility/Balance  Ambulation/Gait Ambulation/Gait: Yes Ambulation/Gait Assistance: 7: Independent Gait Pattern: Left foot flat Gait velocity: 4.3 mph (jogging on TM) x5 minutes     Physical Therapy Assessment and Plan PT Assessment and Plan Clinical Impression Statement: Pt is a 22 year old athletic female referred to PT s/p Lt  knee scope on 08/20/13 with impairments listed below.  At this time time pt is able to complete jogging on the TM and walking lunges without increased pain.  Has minimal patellar restrictions.  She is currently attending the gym to improve cardiovascular fitness. Educated pt to continue with light weight and high reps with Rt and Lt SL extension and knee flexion. Encouraged to continue with squats and heel and toe raises, lunges at the gym. Anticipate 4 visits for  education and improving confidence with exercises and to monitor pain.  Pt will benefit from skilled OP PT to address following impairments: decreased functional ability.   Goals Home Exercise Program Pt/caregiver will Perform Home Exercise Program: Independently PT Goal: Perform Home  Exercise Program - Progress: Goal set today PT Short Term Goals Time to Complete Short Term Goals: 3 weeks PT Short Term Goal 1: Pt will improve her functional strength in order to perform walking lunges without knee instability in order to return to [employer] training.  PT Short Term Goal 2: Pt will improve her gait mechanics in order to run with normalized gait mechanics to decrease risk of secondary injuries.   Problem List Patient Active Problem List   Diagnosis Date Noted  . Gait abnormality 08/28/2013  . S/P left knee arthroscopy 08/20/2013  . Tear of lateral meniscus of left knee 08/20/2013  . KNEE PAIN 09/23/2008  . PATELLAR TENDINITIS 09/23/2008  . TEAR MEDIAL MENISCUS 09/25/2007  . TEAR A C L 09/25/2007    General Behavior During Therapy: [facility] for tasks assessed/performed  GP    Matilyn Fehrman, MPT, ATC 08/28/2013, 4:39 PM  Physician Documentation Your signature is required to indicate approval of the treatment plan as stated above.  Please sign and either send electronically or make a copy of this report for your files and return this physician signed original.   Please mark one 1.__approve of plan  2. ___approve of plan with the following conditions.   ______________________________                                                          _____________________ Physician Signature                                                                                                             Date

## 2013-09-04 ENCOUNTER — Ambulatory Visit (HOSPITAL_COMMUNITY)
Admission: RE | Admit: 2013-09-04 | Discharge: 2013-09-04 | Disposition: A | Discharge status: Home / Self Care | Payer: PRIVATE HEALTH INSURANCE | Source: Ambulatory Visit | Attending: Family Medicine | Admitting: Family Medicine

## 2013-09-04 DIAGNOSIS — M25569 Pain in unspecified knee: Secondary | ICD-10-CM | POA: Insufficient documentation

## 2013-09-04 DIAGNOSIS — R269 Unspecified abnormalities of gait and mobility: Secondary | ICD-10-CM | POA: Insufficient documentation

## 2013-09-04 DIAGNOSIS — IMO0001 Reserved for inherently not codable concepts without codable children: Secondary | ICD-10-CM | POA: Insufficient documentation

## 2013-09-04 DIAGNOSIS — Z9889 Other specified postprocedural states: Secondary | ICD-10-CM

## 2013-09-04 NOTE — Progress Notes (Signed)
Physical Therapy Treatment Patient Details  Name: Sarah Horn MRN: [id number] Date of Birth: 02/27/1991  Today's Date: 09/04/2013 Time: 0933-1008 PT Time Calculation (min): 35 min Charge: TE 0933-1000, Manual 1000-1008  Visit#: 2 of 4  Re-eval: 09/27/13 Assessment Diagnosis: Lt knee scope Surgical Date: 08/20/13 Next MD Visit: Dr. Romeo Apple - 09/11/13 Prior Therapy: 6 years ago  Subjective: Symptoms/Limitations Symptoms: Pt stated pain free today, reports completeing HEP twice since eval and has no questions about the exercises Pain Assessment Currently in Pain?: No/denies  Objective:   Exercise/Treatments Machines for Strengthening Cybex Knee Extension: 6Pl 10x Cybex Knee Flexion: 7 Pl 10x Standing Heel Raises: Limitations Heel Raises Limitations: heel and toe walking Lateral Step Up: Left;15 reps;Hand Hold: 0;Step Height: 8" Forward Step Up: Left;15 reps;Hand Hold: 0;Step Height: 8" Step Down: Left;15 reps;Hand Hold: 1;Step Height: 6" Functional Squat: 15 reps;5 seconds;Limitations Functional Squat Limitations: on BOSU Lunge Walking - Round Trips: 2RT with cueing for posture Rocker Board: 2 minutes;Limitations Rocker Board Limitations: R/L F/B no HHA  Manual Therapy Manual Therapy: Joint mobilization Joint Mobilization: patella mobs all directions   Physical Therapy Assessment and Plan PT Assessment and Plan Clinical Impression Statement: Began POC for Lt kne scope for knee stability.  Pt able to demonstrate appropriate technqiues with all exercises, did require multimodal cueing for forward lunges to improve form.  Manual techniques complete to improve patella tracking and mobility.   PT Plan: Progress to running on TM/Elliptical, sports cord, sled pushing, proper lifting....    Goals Home Exercise Program Pt/caregiver will Perform Home Exercise Program: Independently PT Short Term Goals Time to Complete Short Term Goals: 3 weeks PT Short Term Goal 1: Pt  will improve her functional strength in order to perform walking lunges without knee instability in order to return to [employer] training.  PT Short Term Goal 1 - Progress: Progressing toward goal PT Short Term Goal 2: Pt will improve her gait mechanics in order to run with normalized gait mechanics to decrease risk of secondary injuries.   Problem List Patient Active Problem List   Diagnosis Date Noted  . Gait abnormality 08/28/2013  . S/P left knee arthroscopy 08/20/2013  . Tear of lateral meniscus of left knee 08/20/2013  . KNEE PAIN 09/23/2008  . PATELLAR TENDINITIS 09/23/2008  . TEAR MEDIAL MENISCUS 09/25/2007  . TEAR A C L 09/25/2007    PT - End of Session Activity Tolerance: Patient tolerated treatment well General Behavior During Therapy: [facility] for tasks assessed/performed  GP    Juel Burrow 09/04/2013, 10:10 AM

## 2013-09-06 ENCOUNTER — Ambulatory Visit (HOSPITAL_COMMUNITY)
Admission: RE | Admit: 2013-09-06 | Discharge: 2013-09-06 | Disposition: A | Discharge status: Home / Self Care | Payer: PRIVATE HEALTH INSURANCE | Source: Ambulatory Visit | Attending: Family Medicine | Admitting: Family Medicine

## 2013-09-06 DIAGNOSIS — Z9889 Other specified postprocedural states: Secondary | ICD-10-CM

## 2013-09-06 DIAGNOSIS — R269 Unspecified abnormalities of gait and mobility: Secondary | ICD-10-CM

## 2013-09-06 NOTE — Progress Notes (Signed)
Physical Therapy Treatment Patient Details  Name: Sarah Horn MRN: [id number] Date of Birth: 06-17-1991  Today's Date: 09/06/2013 Time: 8657-8469 PT Time Calculation (min): 40 min Charge: TE 0935-1005, Manual 1006-1015  Visit#: 3 of 4  Re-eval: 09/27/13 Assessment Diagnosis: Lt knee scope Surgical Date: 08/20/13 Next MD Visit: Dr. Romeo Apple - 09/11/13 Prior Therapy: 6 years ago  Subjective: Symptoms/Limitations Symptoms: Pt stated she felt good following last session, reports practicing lunges at home last night and has increased pain Lt knee today. Pain Assessment Currently in Pain?: Yes Pain Score: 5  Pain Location: Knee Pain Orientation: Left  Objective:  Exercise/Treatments Standing Forward Lunges: Both;10 reps;3 seconds;Limitations Forward Lunges Limitations: tactile cueing for posture Wall Squat: 10 reps;10 seconds Lunge Walking - Round Trips: 1RT with cueing for posture Stairs: reciprocal pattern running 67flights Rocker Board: 2 minutes;Limitations Rocker Board Limitations: R/L F/B no HHA Walking with Sports Cord: thick cord 2 RT in long hallway forward, grape vine and retro fast walking   Manual Therapy Manual Therapy: Myofascial release Joint Mobilization: patella mobs all directions Myofascial Release: anterior Lt knee  Physical Therapy Assessment and Plan PT Assessment and Plan Clinical Impression Statement: Began sports cord with cueing to improve mechanics with running.  Pt required multimodal cueing with lunges to improve mechanics to reduce stress to Lt knee.  Manual techniques complete to reduce fascial restrictions and improve patella mobility.  Pt encouraged to apply ice to knee at home for pain control.   PT Plan: Progress to running on TM/Elliptical, sports cord, sled pushing, proper lifting....    Goals Home Exercise Program Pt/caregiver will Perform Home Exercise Program: Independently PT Short Term Goals Time to Complete Short Term  Goals: 3 weeks PT Short Term Goal 1: Pt will improve her functional strength in order to perform walking lunges without knee instability in order to return to [employer] training.  PT Short Term Goal 1 - Progress: Progressing toward goal PT Short Term Goal 2: Pt will improve her gait mechanics in order to run with normalized gait mechanics to decrease risk of secondary injuries.  PT Short Term Goal 2 - Progress: Progressing toward goal  Problem List Patient Active Problem List   Diagnosis Date Noted  . Gait abnormality 08/28/2013  . S/P left knee arthroscopy 08/20/2013  . Tear of lateral meniscus of left knee 08/20/2013  . KNEE PAIN 09/23/2008  . PATELLAR TENDINITIS 09/23/2008  . TEAR MEDIAL MENISCUS 09/25/2007  . TEAR A C L 09/25/2007    PT - End of Session Activity Tolerance: Patient tolerated treatment well;Patient limited by pain General Behavior During Therapy: [facility] for tasks assessed/performed  GP    Juel Burrow 09/06/2013, 12:54 PM

## 2013-09-11 ENCOUNTER — Encounter: Payer: Self-pay | Admitting: Orthopedic Surgery

## 2013-09-11 ENCOUNTER — Ambulatory Visit (HOSPITAL_COMMUNITY)
Admission: RE | Admit: 2013-09-11 | Discharge: 2013-09-11 | Disposition: A | Discharge status: Home / Self Care | Payer: PRIVATE HEALTH INSURANCE | Source: Ambulatory Visit | Attending: Family Medicine | Admitting: Family Medicine

## 2013-09-11 ENCOUNTER — Ambulatory Visit (INDEPENDENT_AMBULATORY_CARE_PROVIDER_SITE_OTHER): Payer: PRIVATE HEALTH INSURANCE | Admitting: Orthopedic Surgery

## 2013-09-11 VITALS — BP 114/78 | Ht 65.0 in | Wt 260.0 lb

## 2013-09-11 DIAGNOSIS — Z9889 Other specified postprocedural states: Secondary | ICD-10-CM

## 2013-09-11 DIAGNOSIS — R269 Unspecified abnormalities of gait and mobility: Secondary | ICD-10-CM

## 2013-09-11 NOTE — Patient Instructions (Signed)
Continue to exercise your knee at home

## 2013-09-11 NOTE — Progress Notes (Signed)
Physical Therapy Treatment Patient Details  Name: Sarah Horn MRN: [id number] Date of Birth: 01/13/1991  Today's Date: 09/11/2013 Time: 1020-1100 PT Time Calculation (min): 40 min Charges: TE: 40 Visit#: 4 of 6  Re-eval: 09/27/13    Authorization:    Authorization Time Period:    Authorization Visit#:   of     Subjective: Symptoms/Limitations Symptoms: No complaints, feels good.   Pain Assessment Currently in Pain?: No/denies  Precautions/Restrictions     Exercise/Treatments Aerobic Tread Mill: 10 minutes 5% incline  Isokinetic: 150<>120<>90 x10 each Plyometrics Bilateral Jumping: Limitations Bilateral Jumping Limitations: 1 round trip Unilateral Jumping: Limitations Unilateral Jumping Limitations: Skipping: 1 round trip Broad Jump: Limitations Broad Jump Limitations: 1 round trip Other Plyometric Exercises: Ladder drills: double limb fast, high knees, side step quick,  in and outs: 1 RT each Other Plyometric Exercises: dynamic warm up: knee to chest holds, high kicks, hip IR/hip ER Standing Forward Step Up: Both;10 reps;Limitations Forward Step Up Limitations: blue mat SLS: Foam w/red ball throws 25 reps (15 continous)    Physical Therapy Assessment and Plan PT Assessment and Plan Clinical Impression Statement: Continued with progressing LE strengthening and balance activities.  Pt without increased pain, reports increased fear when landing back on her Lt leg.  PT Plan: Continue with BOSU squats, fitter, jumping on 4 in. step,    Goals    Problem List Patient Active Problem List   Diagnosis Date Noted  . Gait abnormality 08/28/2013  . S/P left knee arthroscopy 08/20/2013  . Tear of lateral meniscus of left knee 08/20/2013  . KNEE PAIN 09/23/2008  . PATELLAR TENDINITIS 09/23/2008  . TEAR MEDIAL MENISCUS 09/25/2007  . TEAR A C L 09/25/2007    PT - End of Session Activity Tolerance: Patient tolerated treatment well;Patient limited by  pain General Behavior During Therapy: [facility] for tasks assessed/performed PT Plan of Care PT Patient Instructions: discussed gym activities.  Consulted and Agree with Plan of Care: Patient  GP    [name] 09/11/2013, 11:03 AM

## 2013-09-11 NOTE — Progress Notes (Signed)
Patient ID: TYNE BANTA, female   DOB: 09-12-1991, 21 y.o.   MRN: [id number]  Chief Complaint  Patient presents with  . Follow-up    post op 2 SALK DOS 08/17/13    Status post left knee arthroscopy at the time we found she had re\re torn her anterior cruciate ligament she also had some meniscal damage see operative report  She's doing home therapy and doing very well her knee doesn't hurt just full range of motion not having any instability episodes she can do a full straight leg raise and she has no joint swelling  Followup with me after several more weeks of home therapy  Encounter Diagnosis  Name Primary?  . S/P left knee arthroscopy Yes  PRE-OPERATIVE DIAGNOSIS:  left medial meniscal and lateral tear  POST-OPERATIVE DIAGNOSIS:  left  lateral meniscus tear, anterior cruciate ligament graft rupture  PROCEDURE:  Arthroscopy left knee, partial lateral menisectomy and limited debridement , Chondroplasties   SURGEON:  Surgeon(s) and Role:    * Vickki Hearing, MD - Primary

## 2013-09-13 ENCOUNTER — Inpatient Hospital Stay (HOSPITAL_COMMUNITY): Admission: RE | Admit: 2013-09-13 | Payer: PRIVATE HEALTH INSURANCE | Source: Ambulatory Visit

## 2013-09-18 ENCOUNTER — Inpatient Hospital Stay (HOSPITAL_COMMUNITY): Admission: RE | Admit: 2013-09-18 | Payer: PRIVATE HEALTH INSURANCE | Source: Ambulatory Visit

## 2013-09-20 ENCOUNTER — Inpatient Hospital Stay (HOSPITAL_COMMUNITY)
Admission: RE | Admit: 2013-09-20 | Payer: PRIVATE HEALTH INSURANCE | Source: Ambulatory Visit | Admitting: Physical Therapy

## 2013-11-06 ENCOUNTER — Encounter: Payer: Self-pay | Admitting: Orthopedic Surgery

## 2013-11-06 ENCOUNTER — Ambulatory Visit: Payer: PRIVATE HEALTH INSURANCE | Admitting: Orthopedic Surgery

## 2014-05-12 ENCOUNTER — Emergency Department (HOSPITAL_COMMUNITY)
Admission: EM | Admit: 2014-05-12 | Discharge: 2014-05-12 | Disposition: A | Discharge status: Home / Self Care | Payer: 59 | Attending: Emergency Medicine | Admitting: Emergency Medicine

## 2014-05-12 ENCOUNTER — Emergency Department (HOSPITAL_COMMUNITY): Payer: 59

## 2014-05-12 ENCOUNTER — Encounter (HOSPITAL_COMMUNITY): Payer: Self-pay | Admitting: Emergency Medicine

## 2014-05-12 DIAGNOSIS — R569 Unspecified convulsions: Secondary | ICD-10-CM

## 2014-05-12 DIAGNOSIS — R4182 Altered mental status, unspecified: Secondary | ICD-10-CM | POA: Diagnosis present

## 2014-05-12 DIAGNOSIS — Z3202 Encounter for pregnancy test, result negative: Secondary | ICD-10-CM | POA: Insufficient documentation

## 2014-05-12 DIAGNOSIS — R51 Headache: Secondary | ICD-10-CM | POA: Insufficient documentation

## 2014-05-12 LAB — URINALYSIS, ROUTINE W REFLEX MICROSCOPIC
Bilirubin Urine: NEGATIVE
GLUCOSE, UA: NEGATIVE mg/dL
Hgb urine dipstick: NEGATIVE
Ketones, ur: NEGATIVE mg/dL
Leukocytes, UA: NEGATIVE
Nitrite: NEGATIVE
PROTEIN: 30 mg/dL — AB
SPECIFIC GRAVITY, URINE: 1.02 (ref 1.005–1.030)
Urobilinogen, UA: 0.2 mg/dL (ref 0.0–1.0)
pH: 6.5 (ref 5.0–8.0)

## 2014-05-12 LAB — CBC WITH DIFFERENTIAL/PLATELET
BASOS ABS: 0 10*3/uL (ref 0.0–0.1)
Basophils Relative: 1 % (ref 0–1)
EOS PCT: 1 % (ref 0–5)
Eosinophils Absolute: 0 10*3/uL (ref 0.0–0.7)
HEMATOCRIT: 37.9 % (ref 36.0–46.0)
Hemoglobin: 12.6 g/dL (ref 12.0–15.0)
LYMPHS ABS: 2.9 10*3/uL (ref 0.7–4.0)
Lymphocytes Relative: 34 % (ref 12–46)
MCH: 27.6 pg (ref 26.0–34.0)
MCHC: 33.2 g/dL (ref 30.0–36.0)
MCV: 83.1 fL (ref 78.0–100.0)
MONO ABS: 0.5 10*3/uL (ref 0.1–1.0)
Monocytes Relative: 6 % (ref 3–12)
Neutro Abs: 4.9 10*3/uL (ref 1.7–7.7)
Neutrophils Relative %: 58 % (ref 43–77)
Platelets: 165 10*3/uL (ref 150–400)
RBC: 4.56 MIL/uL (ref 3.87–5.11)
RDW: 12.9 % (ref 11.5–15.5)
WBC: 8.4 10*3/uL (ref 4.0–10.5)

## 2014-05-12 LAB — URINE MICROSCOPIC-ADD ON

## 2014-05-12 LAB — PREGNANCY, URINE: PREG TEST UR: NEGATIVE

## 2014-05-12 LAB — TROPONIN I

## 2014-05-12 LAB — COMPREHENSIVE METABOLIC PANEL
ALT: 16 U/L (ref 0–35)
AST: 21 U/L (ref 0–37)
Albumin: 3.9 g/dL (ref 3.5–5.2)
Alkaline Phosphatase: 51 U/L (ref 39–117)
Anion gap: 13 (ref 5–15)
BUN: 11 mg/dL (ref 6–23)
CALCIUM: 9.4 mg/dL (ref 8.4–10.5)
CO2: 24 meq/L (ref 19–32)
CREATININE: 0.72 mg/dL (ref 0.50–1.10)
Chloride: 103 mEq/L (ref 96–112)
GLUCOSE: 87 mg/dL (ref 70–99)
Potassium: 4.2 mEq/L (ref 3.7–5.3)
Sodium: 140 mEq/L (ref 137–147)
Total Bilirubin: 0.3 mg/dL (ref 0.3–1.2)
Total Protein: 7.5 g/dL (ref 6.0–8.3)

## 2014-05-12 LAB — RAPID URINE DRUG SCREEN, HOSP PERFORMED
Amphetamines: NOT DETECTED
BENZODIAZEPINES: NOT DETECTED
Barbiturates: NOT DETECTED
Cocaine: NOT DETECTED
OPIATES: NOT DETECTED
Tetrahydrocannabinol: NOT DETECTED

## 2014-05-12 LAB — PROLACTIN: PROLACTIN: 3.5 ng/mL

## 2014-05-12 MED ORDER — METOCLOPRAMIDE HCL 5 MG/ML IJ SOLN
10.0000 mg | Freq: Once | INTRAMUSCULAR | Status: AC
  Administered 2014-05-12: 10 mg via INTRAVENOUS
  Filled 2014-05-12: qty 2

## 2014-05-12 MED ORDER — DIPHENHYDRAMINE HCL 50 MG/ML IJ SOLN
25.0000 mg | Freq: Once | INTRAMUSCULAR | Status: AC
  Administered 2014-05-12: 25 mg via INTRAVENOUS
  Filled 2014-05-12: qty 1

## 2014-05-12 MED ORDER — KETOROLAC TROMETHAMINE 30 MG/ML IJ SOLN
30.0000 mg | Freq: Once | INTRAMUSCULAR | Status: AC
  Administered 2014-05-12: 30 mg via INTRAVENOUS
  Filled 2014-05-12: qty 1

## 2014-05-12 MED ORDER — SODIUM CHLORIDE 0.9 % IV BOLUS (SEPSIS)
1000.0000 mL | Freq: Once | INTRAVENOUS | Status: AC
  Administered 2014-05-12: 1000 mL via INTRAVENOUS

## 2014-05-12 NOTE — ED Notes (Signed)
RN with patient to CT.  

## 2014-05-12 NOTE — ED Notes (Signed)
EMS reports pt c/o headache for the past 2 days and vomited today.  EMS administered 4mg  zofran IV.  Reports pt was last seen normal today at 1000.  Family says came home from church a few min ago and found pt sitting in chair with ams.  Reports pt was supposed to go to work.  Asked pt to state her name and she shrugged like she didn't know her name.  Also shook head no when asked when was her birthday.  Pt verbalized, "I have to go to work."  BP was 150/90, CBG 92, HR 74 and SR per ems, and 98% on room air.  EMS denies any recent fall or trauma.

## 2014-05-12 NOTE — Discharge Instructions (Signed)
You may have had a seizure today. NO DRIVING OR ANY ACTIVITY THAT YOU COULD GET HURT IF YOU HAD ANOTHER EPISODE!!!  Return to the ED if you have another episode to be evaluated. Otherwise, call Dr Freddie Apley office to get further evaluation for a possible seizure.     Driving and Equipment Restrictions Some medical problems make it dangerous to drive, ride a bike, or use machines. Some of these problems are:  A hard blow to the head (concussion).  Passing out (fainting).  Twitching and shaking (seizures).  Low blood sugar.  Taking medicine to help you relax (sedatives).  Taking pain medicines.  Wearing an eye patch.  Wearing splints. This can make it hard to use parts of your body that you need to drive safely. HOME CARE   Do not drive until your doctor says it is okay.  Do not use machines until your doctor says it is okay. You may need a form signed by your doctor (medical release) before you can drive again. You may also need this form before you do other tasks where you need to be fully alert. MAKE SURE YOU:  Understand these instructions.  Will watch your condition.  Will get help right away if you are not doing well or get worse. Document Released: 11/25/2004 Document Revised: 01/10/2012 Document Reviewed: 02/25/2010 [facility] Patient Information 2015 [place], [place]. This information is not intended to replace advice given to you by your health care provider. Make sure you discuss any questions you have with your health care provider.

## 2014-05-12 NOTE — ED Notes (Signed)
Notified Dr. Tomi Bamberger of pt, CT head ordered.

## 2014-05-12 NOTE — ED Provider Notes (Signed)
CSN: [id number]     Arrival date & time 05/12/14  1359 History  This chart was scribed for Sarah Norrie, MD by Vernell Barrier, ED scribe. This patient was seen in room APA15/[facility]15 and the patient's care was started at 2:12 PM.    Chief Complaint  Patient presents with  . Altered Mental Status   The history is provided by the EMS personnel and a parent. The history is limited by the condition of the patient. No language interpreter was used.    LEVEL 5 CAVEAT DUE TO ALTERED  MENTAL STATUS  HPI Comments: Sarah Horn is a 23 y.o. female who presents to the Emergency Department BIB EMS with altered mental status. Per EMS; pt had been complaining of HA for 2 days. Pt is non verbal but awake and follows simple commands. Nurse states patient ? incontinent of urine. Mother told EMS has been complaining of HA for the past 2 days but was perfectly normal before she left for church this morning at 10 AM. Pt was supposed to go to work but upon father returning; pt was found in bed. Father states she looked confused, did not recognize anyone but was hollering for her mother. Easily agitated; did not want to be touched or bother. Kept repeating that she needed to go to work. Father states they moved her to a chair before EMS arrived. Pt had throw up all over her clothes and family attempted to clean her with wet towels.    PCP Dr Wolfgang Phoenix  Past Medical History  Diagnosis Date  . Irregular menstrual cycle    Past Surgical History  Procedure Laterality Date  . Anterior cruciate ligament repair      left, [facility]; [place]  . Breast biopsy  06/30/2011    Procedure: BREAST BIOPSY;  Surgeon: Donato Heinz;  Location: [facility];  Service: General;  Laterality: Left;  . Knee arthroscopy with lateral menisectomy Left 08/17/2013    Procedure: KNEE ARTHROSCOPY WITH PARTIAL LATERAL MENISECTOMY, LIMITED DEBRIDEMENT;  Surgeon: Carole Civil, MD;  Location: [facility];  Service: Orthopedics;  Laterality: Left;   . Examination under anesthesia Left 08/17/2013    Procedure: EXAM UNDER ANESTHESIA LEFT KNEE;  Surgeon: Carole Civil, MD;  Location: [facility];  Service: Orthopedics;  Laterality: Left;   Family History  Problem Relation Age of Onset  . Anesthesia problems Neg Hx    History  Substance Use Topics  . Smoking status: Never Smoker   . Smokeless tobacco: Not on file  . Alcohol Use: No   Lives with parents  OB History   Grav Para Term Preterm Abortions TAB SAB Ect Mult Living                 Review of Systems  Unable to perform ROS  Allergies  Review of patient's allergies indicates no known allergies.  Home Medications   Prior to Admission medications   Not on File   BP 119/63  Pulse 84  Temp(Src) 98.4 F (36.9 C) (Rectal)  Resp 15  SpO2 92%  Vital signs normal   Physical Exam  Nursing note and vitals reviewed. Constitutional: She appears well-developed and well-nourished.  Non-toxic appearance. She does not appear ill. No distress.  Patient is awake but just stares and moves and acts slow and depressed. She could not tell me her name. She was not verbal to any questions to me.  HENT:  Head: Normocephalic and atraumatic.  Right Ear: External ear normal.  Left Ear: External ear normal.  Nose: Nose normal. No mucosal edema or rhinorrhea.  Mouth/Throat: Oropharynx is clear and moist and mucous membranes are normal. No dental abscesses or uvula swelling.  No obvious trauma to tongue  Eyes: Conjunctivae and EOM are normal. Pupils are equal, round, and reactive to light.  Neck: Normal range of motion and full passive range of motion without pain. Neck supple.  Cardiovascular: Normal rate, regular rhythm and normal heart sounds.  Exam reveals no gallop and no friction rub.   No murmur heard. Pulmonary/Chest: Effort normal and breath sounds normal. No respiratory distress. She has no wheezes. She has no rhonchi. She has no rales. She exhibits no tenderness and no  crepitus.  Abdominal: Soft. Normal appearance and bowel sounds are normal. She exhibits no distension. There is no tenderness. There is no rebound and no guarding.  Musculoskeletal: Normal range of motion. She exhibits no edema and no tenderness.  Moves all extremities well.   Neurological: She is alert. She has normal strength. No cranial nerve deficit.  Awake, follows simple commands, but is nonverbal to me  Skin: Skin is warm, dry and intact. No rash noted. No erythema. No pallor.  Psychiatric: Her mood appears not anxious. She is slowed. She exhibits a depressed mood.  Delayed and slow response.   ED Course  Procedures (including critical care time)  Medications  sodium chloride 0.9 % bolus 1,000 mL (0 mLs Intravenous Stopped 05/12/14 1902)  ketorolac (TORADOL) 30 MG/ML injection 30 mg (30 mg Intravenous Given 05/12/14 1644)  metoCLOPramide (REGLAN) injection 10 mg (10 mg Intravenous Given 05/12/14 1644)  diphenhydrAMINE (BENADRYL) injection 25 mg (25 mg Intravenous Given 05/12/14 1644)    COORDINATION OF CARE: At 2:13 PM: Treatment will include a CT scan of the head.    4:05 PM: Pt is now alert, talking, and responding. Feeling better. Able to recall incidents leading up to ED arrival.  Pt states she is feeling better but still reports throbbing frontal HA. No photophobia currently but intermittently occurs. Recalls that she was getting ready to work and filling her water bottle. States she bent down to spray ants upon standing up felt dizzy. Started throwing up and went to lay on her bed. Says she woke up in the hospital. Father used to have seizures.Patient states she is having a headache she was given medications. She is now more alert, talking and acting appropriate.   At time of discharge patient states her headache is gone. She is fully alert and oriented and acts normal.  Patient appears to have had a post ictal state. She is being referred to neurology to get EEG. Her family was  instructed if she has another episode to return to the ED. At that time consideration for anticonvulsants would be done.   Labs Review Results for orders placed during the hospital encounter of 05/12/14  URINALYSIS, ROUTINE W REFLEX MICROSCOPIC      Result Value Ref Range   Color, Urine YELLOW  YELLOW   APPearance HAZY (*) CLEAR   Specific Gravity, Urine 1.020  1.005 - 1.030   pH 6.5  5.0 - 8.0   Glucose, UA NEGATIVE  NEGATIVE mg/dL   Hgb urine dipstick NEGATIVE  NEGATIVE   Bilirubin Urine NEGATIVE  NEGATIVE   Ketones, ur NEGATIVE  NEGATIVE mg/dL   Protein, ur 30 (*) NEGATIVE mg/dL   Urobilinogen, UA 0.2  0.0 - 1.0 mg/dL   Nitrite NEGATIVE  NEGATIVE   Leukocytes, UA NEGATIVE  NEGATIVE  PREGNANCY, URINE      Result Value Ref Range   Preg Test, Ur NEGATIVE  NEGATIVE  URINE RAPID DRUG SCREEN (HOSP PERFORMED)      Result Value Ref Range   Opiates NONE DETECTED  NONE DETECTED   Cocaine NONE DETECTED  NONE DETECTED   Benzodiazepines NONE DETECTED  NONE DETECTED   Amphetamines NONE DETECTED  NONE DETECTED   Tetrahydrocannabinol NONE DETECTED  NONE DETECTED   Barbiturates NONE DETECTED  NONE DETECTED  CBC WITH DIFFERENTIAL      Result Value Ref Range   WBC 8.4  4.0 - 10.5 K/uL   RBC 4.56  3.87 - 5.11 MIL/uL   Hemoglobin 12.6  12.0 - 15.0 g/dL   HCT 37.9  36.0 - 46.0 %   MCV 83.1  78.0 - 100.0 fL   MCH 27.6  26.0 - 34.0 pg   MCHC 33.2  30.0 - 36.0 g/dL   RDW 12.9  11.5 - 15.5 %   Platelets 165  150 - 400 K/uL   Neutrophils Relative % 58  43 - 77 %   Neutro Abs 4.9  1.7 - 7.7 K/uL   Lymphocytes Relative 34  12 - 46 %   Lymphs Abs 2.9  0.7 - 4.0 K/uL   Monocytes Relative 6  3 - 12 %   Monocytes Absolute 0.5  0.1 - 1.0 K/uL   Eosinophils Relative 1  0 - 5 %   Eosinophils Absolute 0.0  0.0 - 0.7 K/uL   Basophils Relative 1  0 - 1 %   Basophils Absolute 0.0  0.0 - 0.1 K/uL  COMPREHENSIVE METABOLIC PANEL      Result Value Ref Range   Sodium 140  137 - 147 mEq/L   Potassium 4.2   3.7 - 5.3 mEq/L   Chloride 103  96 - 112 mEq/L   CO2 24  19 - 32 mEq/L   Glucose, Bld 87  70 - 99 mg/dL   BUN 11  6 - 23 mg/dL   Creatinine, Ser 0.72  0.50 - 1.10 mg/dL   Calcium 9.4  8.4 - 10.5 mg/dL   Total Protein 7.5  6.0 - 8.3 g/dL   Albumin 3.9  3.5 - 5.2 g/dL   AST 21  0 - 37 U/L   ALT 16  0 - 35 U/L   Alkaline Phosphatase 51  39 - 117 U/L   Total Bilirubin 0.3  0.3 - 1.2 mg/dL   GFR calc non Af Amer >90  >90 mL/min   GFR calc Af Amer >90  >90 mL/min   Anion gap 13  5 - 15  TROPONIN I      Result Value Ref Range   Troponin I <0.30  <0.30 ng/mL  URINE MICROSCOPIC-ADD ON      Result Value Ref Range   Squamous Epithelial / LPF FEW (*) RARE   WBC, UA 0-2  <3 WBC/hpf   RBC / HPF 0-2  <3 RBC/hpf   Bacteria, UA FEW (*) RARE    Laboratory interpretation all normal. Prolactin pending  Imaging Review Ct Head Wo Contrast  05/12/2014   CLINICAL DATA:  Altered mental status  EXAM: CT HEAD WITHOUT CONTRAST  TECHNIQUE: Contiguous axial images were obtained from the base of the skull through the vertex without intravenous contrast.  COMPARISON:  09/24/2007  FINDINGS: There is streak artifact through the posterior fossa of from bedding material posterior patient's head. Exam was repeated with little improvement.  No acute intracranial hemorrhage. No focal mass lesion. No CT evidence of acute infarction. No midline shift or mass effect. No hydrocephalus. Basilar cisterns are patent. Paranasal sinuses and mastoid air cells are clear.  IMPRESSION: 1. No acute intracranial findings.  No change from prior CT.   Electronically Signed   By: Suzy Bouchard M.D.   On: 05/12/2014 15:02     EKG Interpretation   Date/Time:  Sunday May 12 2014 14:08:02 EDT Ventricular Rate:  82 PR Interval:  143 QRS Duration: 78 QT Interval:  371 QTC Calculation: 433 R Axis:   88 Text Interpretation:  Sinus rhythm Borderline T abnormalities, anterior  leads Otherwise within normal limits No old tracing to  compare Confirmed  by Quadarius Henton  MD-I, Adda Stokes ([id number]) on 05/12/2014 3:04:05 PM      MDM   Final diagnoses:  Post-ictal state    Plan discharge  Rolland Porter, MD, FACEP   I personally performed the services described in this documentation, which was scribed in my presence. The recorded information has been reviewed and considered.  Rolland Porter, MD, Abram Sander     Sarah Norrie, MD 05/12/14 2034

## 2014-05-12 NOTE — ED Notes (Signed)
Pt states that she is feeling better, update given on plan of care,

## 2014-05-12 NOTE — ED Notes (Signed)
Dr Knapp at bedside,  

## 2014-05-12 NOTE — ED Notes (Signed)
Patient incontinent of urine. Confused. Slow to respond, but will shake head yes and no to simple questions. Follows simple commands. Grips weak, but equal. Movement to all extremities.

## 2014-05-13 ENCOUNTER — Ambulatory Visit (INDEPENDENT_AMBULATORY_CARE_PROVIDER_SITE_OTHER): Payer: 59 | Admitting: Nurse Practitioner

## 2014-05-13 ENCOUNTER — Encounter: Payer: Self-pay | Admitting: Family Medicine

## 2014-05-13 ENCOUNTER — Encounter: Payer: Self-pay | Admitting: Nurse Practitioner

## 2014-05-13 VITALS — BP 110/80 | HR 83 | Temp 99.5°F | Ht 66.0 in | Wt 276.5 lb

## 2014-05-13 DIAGNOSIS — G43001 Migraine without aura, not intractable, with status migrainosus: Secondary | ICD-10-CM

## 2014-05-13 DIAGNOSIS — N912 Amenorrhea, unspecified: Secondary | ICD-10-CM

## 2014-05-13 DIAGNOSIS — F05 Delirium due to known physiological condition: Secondary | ICD-10-CM

## 2014-05-14 LAB — PROGESTERONE: Progesterone: 0.4 ng/mL

## 2014-05-14 LAB — LUTEINIZING HORMONE: LH: 5.3 m[IU]/mL

## 2014-05-14 LAB — FOLLICLE STIMULATING HORMONE: FSH: 5 m[IU]/mL

## 2014-05-16 ENCOUNTER — Encounter: Payer: Self-pay | Admitting: Nurse Practitioner

## 2014-05-16 DIAGNOSIS — G43001 Migraine without aura, not intractable, with status migrainosus: Secondary | ICD-10-CM | POA: Insufficient documentation

## 2014-05-16 DIAGNOSIS — N912 Amenorrhea, unspecified: Secondary | ICD-10-CM | POA: Insufficient documentation

## 2014-05-16 NOTE — Progress Notes (Signed)
Subjective:  Presents for followup after ED visit yesterday for possible seizure. Patient was getting ready to go to work was having a slight throbbing headache in the frontal area, bent over to get something out of a cabinet, stood up suddenly and had sudden nausea and vomiting. Patient went to lay down and does not remember anything until ED visit when they were taking a rectal temperature. Her family came in after church and her heard her calling for her "Mama"asking her to "come here". Told them "I threw up". Family noticed vomit on her head and she was confused about where she was. Describes a "dead stare". Would not follow commands. Fearful. Crying at times. Did not seem to recognize anyone. Family used wet towels to clean up vomit and called EMS. They're absolutely certain that there was no urinary or fecal incontinence. No previous history of incidences such as this. Has remote history of migraines and has a flareup over the past year. Describes a throbbing pain in the frontal area with photosensitivity, photophobia and blurred vision. Occurs 2-3 times per week. Occasional relief with Advil. Usually relieved when she lies down a dark room and cools off. The only trigger she is identified is getting overheated. No numbness or weakness of the face arms or legs. No difficulty speaking or swallowing. Had a headache 2 days ago slight throbbing took some Advil. Slept well the night before this incident. Has not had a menstrual cycle since September. Denies any drug or alcohol use. Has had an eye exam within the past year. Strong family history of migraines including her mother.    Objective:   BP 110/80  Pulse 83  Temp(Src) 99.5 F (37.5 C) (Oral)  Ht 5\' 6"  (1.676 m)  Wt 276 lb 8 oz (125.42 kg)  BMI 44.65 kg/m2  NAD. Alert, oriented. Cheerful affect. TMs normal limit. Pharynx clear. Neck supple with mild soft anterior adenopathy. Lungs clear. Heart regular rate rhythm. See lab work and CT scan done from  ED.  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 Advised patient to avoid medications such as triptans until neurologic workup. Avoid sudden position changes. Continue ibuprofen as directed. Patient to call or go to ED sooner if symptoms recur.

## 2014-05-21 ENCOUNTER — Telehealth: Payer: Self-pay | Admitting: Neurology

## 2014-05-23 ENCOUNTER — Encounter: Payer: Self-pay | Admitting: Neurology

## 2014-05-23 ENCOUNTER — Ambulatory Visit (INDEPENDENT_AMBULATORY_CARE_PROVIDER_SITE_OTHER): Payer: 59 | Admitting: Neurology

## 2014-05-23 ENCOUNTER — Other Ambulatory Visit (INDEPENDENT_AMBULATORY_CARE_PROVIDER_SITE_OTHER): Payer: 59 | Admitting: Radiology

## 2014-05-23 VITALS — BP 113/74 | HR 69 | Ht 66.0 in | Wt 277.2 lb

## 2014-05-23 DIAGNOSIS — R569 Unspecified convulsions: Secondary | ICD-10-CM

## 2014-05-23 DIAGNOSIS — R51 Headache: Secondary | ICD-10-CM

## 2014-05-23 DIAGNOSIS — R519 Headache, unspecified: Secondary | ICD-10-CM | POA: Insufficient documentation

## 2014-05-23 DIAGNOSIS — E669 Obesity, unspecified: Secondary | ICD-10-CM

## 2014-05-23 LAB — ESTRADIOL, FREE
ESTRADIOL FREE: 0.99 pg/mL
ESTRADIOL: 39 pg/mL

## 2014-05-23 NOTE — Patient Instructions (Signed)
I had a long discussion with the patient and her mother regarding her episode of confusion disorientation followed by headache which probably was an unwitnessed seizure followed by postictal state. Recommend checking MRI scan of the brain and EEG. I also counseled her about her migraine headaches and advised her to increase participation in activities for stress relaxation. She was also encouraged to change her eating habits and lifestyle and lose weight. Check MRI scan of the brain, MRA and MRV and EEG. She was advised not to drive as per [facility]. We will hold off on anticonvulsants unless the EEG or MRI is abnormal or she has a second episode. Return for followup in 2 months or call earlier if necessary  Seizure, Adult A seizure is abnormal electrical activity in the brain. Seizures usually last from 30 seconds to 2 minutes. There are various types of seizures. Before a seizure, you may have a warning sensation (aura) that a seizure is about to occur. An aura may include the following symptoms:   Fear or anxiety.  Nausea.  Feeling like the room is spinning (vertigo).  Vision changes, such as seeing flashing lights or spots. Common symptoms during a seizure include:  A change in attention or behavior (altered mental status).  Convulsions with rhythmic jerking movements.  Drooling.  Rapid eye movements.  Grunting.  Loss of bladder and bowel control.  Bitter taste in the mouth.  Tongue biting. After a seizure, you may feel confused and sleepy. You may also have an injury resulting from convulsions during the seizure. HOME CARE INSTRUCTIONS   If you are given medicines, take them exactly as prescribed by your health care provider.  Keep all follow-up appointments as directed by your health care provider.  Do not swim or drive or engage in risky activity during which a seizure could cause further injury to you or others until your health care provider says it is  OK.  Get adequate rest.  Teach friends and family what to do if you have a seizure. They should:  Lay you on the ground to prevent a fall.  Put a cushion under your head.  Loosen any tight clothing around your neck.  Turn you on your side. If vomiting occurs, this helps keep your airway clear.  Stay with you until you recover.  Know whether or not you need emergency care. SEEK IMMEDIATE MEDICAL CARE IF:  The seizure lasts longer than 5 minutes.  The seizure is severe or you do not wake up immediately after the seizure.  You have an altered mental status after the seizure.  You are having more frequent or worsening seizures. Someone should drive you to the emergency department or call local emergency services (911 in [place] MAKE SURE YOU:  Understand these instructions.  Will watch your condition.  Will get help right away if you are not doing well or get worse. Document Released: 10/15/2000 Document Revised: 08/08/2013 Document Reviewed: 05/30/2013 [facility] Patient Information 2015 [place], [place]. This information is not intended to replace advice given to you by your health care provider. Make sure you discuss any questions you have with your health care provider. General Headache Without Cause A headache is pain or discomfort felt around the head or neck area. The specific cause of a headache may not be found. There are many causes and types of headaches. A few common ones are:  Tension headaches.  Migraine headaches.  Cluster headaches.  Chronic daily headaches. HOME CARE INSTRUCTIONS   Keep  all follow-up appointments with your caregiver or any specialist referral.  Only take over-the-counter or prescription medicines for pain or discomfort as directed by your caregiver.  Lie down in a dark, quiet room when you have a headache.  Keep a headache journal to find out what may trigger your migraine headaches. For example, write down:  What you eat and  drink.  How much sleep you get.  Any change to your diet or medicines.  Try massage or other relaxation techniques.  Put ice packs or heat on the head and neck. Use these 3 to 4 times per day for 15 to 20 minutes each time, or as needed.  Limit stress.  Sit up straight, and do not tense your muscles.  Quit smoking if you smoke.  Limit alcohol use.  Decrease the amount of caffeine you drink, or stop drinking caffeine.  Eat and sleep on a regular schedule.  Get 7 to 9 hours of sleep, or as recommended by your caregiver.  Keep lights dim if bright lights bother you and make your headaches worse. SEEK MEDICAL CARE IF:   You have problems with the medicines you were prescribed.  Your medicines are not working.  You have a change from the usual headache.  You have nausea or vomiting. SEEK IMMEDIATE MEDICAL CARE IF:   Your headache becomes severe.  You have a fever.  You have a stiff neck.  You have loss of vision.  You have muscular weakness or loss of muscle control.  You start losing your balance or have trouble walking.  You feel faint or pass out.  You have severe symptoms that are different from your first symptoms. MAKE SURE YOU:   Understand these instructions.  Will watch your condition.  Will get help right away if you are not doing well or get worse. Document Released: 10/18/2005 Document Revised: 01/10/2012 Document Reviewed: 11/03/2011 [facility] Patient Information 2015 [place], [place]. This information is not intended to replace advice given to you by your health care provider. Make sure you discuss any questions you have with your health care provider.

## 2014-05-23 NOTE — Progress Notes (Signed)
[facility] [address] [place]. [place] [address] [phone]       OFFICE CONSULT NOTE  Ms. Sarah Horn Date of Birth:  [birth year] Medical Record Number:  [id number]   Referring MD:  Rolland Porter  Reason for Referral:   headache  HPI: 74 year African American lady who had an episode of unwitnessed loss of consciousness on 05/12/14. She states that she got up in the morning and was preparing to go to work and remembers throwing up. The next thing she remembers is waking up in the ambulance enroute to the hospital. Her mother   arrived at the patient's home  45 minutes later and she was able to get herself in and found her daughter lying on the bed and she called out to her. The patient's daughter was confused and disoriented and did not recognize mother. She noted that the patient had wvomit on  head and face and she called 911. The patient   complained of headache on the way andvomitted  one more time. She remained confused for couple of hours and gradually improved. She was easily agitated and kept repeating that she needed to go to work. In the ER she was found to have a nonfocal exam CT scan of the head  Which I personally reviewed is unremarkable. Urine drug screen was negative CBC and compressive metabolic panel labs were unremarkable. Patient was thought to have had a severe migraine headache episode and was given pain medications and discharged home. She remained sleepy and tired the next couple of days and complained of severe body aches and needed to take ibuprofen. She now feels back to normal her headaches and bodyaches have resolved. She has no prior history of loss of consciousness seizures significant head injury childhood history of epilepsy. She does have family history of epilepsy in her dad which started in his childhood but require lifelong medications. Patient does have history of episodic headaches history of migraine but does also complain of tightness  in the back of her head as well as some sharp and throbbing headaches. These occur couple of times a week and usually respond to ibuprofen.  ROS:   14 system review of systems is positive for headache, vomiting, confusion, disorientation and all the systems negative  PMH:  Past Medical History  Diagnosis Date  . Irregular menstrual cycle     Social History:  History   Social History  . Marital Status: Single    Spouse Name: N/A    Number of Children: 0  . Years of Education: 12th   Occupational History  . [employer]    Social History Main Topics  . Smoking status: Never Smoker   . Smokeless tobacco: Never Used  . Alcohol Use: No  . Drug Use: No  . Sexual Activity: No   Other Topics Concern  . Not on file   Social History Narrative   Patient lives at home with her family   Patient right handed   Patient drinks sodas and coffee    Medications:   No current outpatient prescriptions on file prior to visit.   No current facility-administered medications on file prior to visit.    Allergies:  No Known Allergies  Physical Exam General:obese young African American lady, seated, in no evident distress Head: head normocephalic and atraumatic. Orohparynx benign Neck: supple with no carotid or supraclavicular bruits Cardiovascular: regular rate and rhythm, no murmurs Musculoskeletal: no deformity Skin:  no rash/petichiae Vascular:  Normal pulses  all extremities Filed Vitals:   05/23/14 0813  BP: 113/74  Pulse: 69    Neurologic Exam Mental Status: Awake and fully alert. Oriented to place and time. Recent and remote memory intact. Attention span, concentration and fund of knowledge appropriate. Mood and affect appropriate.  Cranial Nerves: Fundoscopic exam reveals sharp disc margins. Pupils equal, briskly reactive to light. Extraocular movements full without nystagmus. Visual fields full to confrontation. Hearing intact. Facial sensation intact. Face, tongue, palate  moves normally and symmetrically.  Motor: Normal bulk and tone. Normal strength in all tested extremity muscles. Sensory.: intact to tough and pinprick and vibratory sensation.  Coordination: Rapid alternating movements normal in all extremities. Finger-to-nose and heel-to-shin performed accurately bilaterally. Gait and Station: Arises from chair without difficulty. Stance is normal. Gait demonstrates normal stride length and balance . Able to heel, toe and tandem walk without difficulty.  Reflexes: 1+ and symmetric. Toes downgoing.       ASSESSMENT: 23 year old [place] American lady who with single episode of unwitnessed loss of consciousness on 05/12/14 preceded by vomiting and followed by confusion ,disorientation and headache possibly unwitnessed seizure. History of mixed migraine and tension headaches as well.Marland Horn    PLAN: I had a long discussion with the patient and her mother regarding her episode of confusion disorientation followed by headache which probably was an unwitnessed seizure followed by postictal state. Recommend checking MRI scan of the brain and EEG. I also counseled her about her migraine headaches and advised her to increase participation in activities for stress relaxation. She was also encouraged to change her eating habits and lifestyle and lose weight. Check MRI scan of the brain, MRA and MRV and EEG. She was advised not to drive as per [facility]. We will hold off on anticonvulsants unless the EEG or MRI is abnormal or she has a second episode. Return for followup in 2 months or call earlier if necessary   Note: This document was prepared with digital dictation and possible smart phrase technology. Any transcriptional errors that result from this process are unintentional.

## 2014-05-28 ENCOUNTER — Other Ambulatory Visit: Payer: Self-pay | Admitting: Neurology

## 2014-05-28 DIAGNOSIS — R569 Unspecified convulsions: Secondary | ICD-10-CM

## 2014-06-04 ENCOUNTER — Other Ambulatory Visit: Payer: Self-pay | Admitting: Nurse Practitioner

## 2014-06-04 MED ORDER — NORGESTIMATE-ETH ESTRADIOL 0.25-35 MG-MCG PO TABS: 1.0000 | ORAL_TABLET | Freq: Every day | ORAL | Status: DC

## 2014-08-30 ENCOUNTER — Ambulatory Visit: Payer: Self-pay | Admitting: Neurology

## 2014-09-02 ENCOUNTER — Encounter: Payer: Self-pay | Admitting: Neurology

## 2015-08-20 NOTE — Telephone Encounter (Signed)
error 

## 2017-09-13 ENCOUNTER — Encounter (HOSPITAL_COMMUNITY): Payer: Self-pay | Admitting: Emergency Medicine

## 2017-09-13 ENCOUNTER — Emergency Department (HOSPITAL_COMMUNITY)
Admission: EM | Admit: 2017-09-13 | Discharge: 2017-09-13 | Disposition: A | Discharge status: Home / Self Care | Payer: BC Managed Care – PPO | Attending: Emergency Medicine | Admitting: Emergency Medicine

## 2017-09-13 DIAGNOSIS — B9789 Other viral agents as the cause of diseases classified elsewhere: Secondary | ICD-10-CM | POA: Insufficient documentation

## 2017-09-13 DIAGNOSIS — I10 Essential (primary) hypertension: Secondary | ICD-10-CM

## 2017-09-13 DIAGNOSIS — Z79899 Other long term (current) drug therapy: Secondary | ICD-10-CM | POA: Insufficient documentation

## 2017-09-13 DIAGNOSIS — J069 Acute upper respiratory infection, unspecified: Secondary | ICD-10-CM | POA: Diagnosis not present

## 2017-09-13 DIAGNOSIS — R05 Cough: Secondary | ICD-10-CM | POA: Diagnosis present

## 2017-09-13 LAB — RAPID STREP SCREEN (MED CTR MEBANE ONLY): STREPTOCOCCUS, GROUP A SCREEN (DIRECT): NEGATIVE

## 2017-09-13 MED ORDER — ALBUTEROL SULFATE HFA 108 (90 BASE) MCG/ACT IN AERS
2.0000 | INHALATION_SPRAY | Freq: Once | RESPIRATORY_TRACT | Status: AC
  Administered 2017-09-13: 2 via RESPIRATORY_TRACT
  Filled 2017-09-13: qty 6.7

## 2017-09-13 MED ORDER — MAGIC MOUTHWASH W/LIDOCAINE: 5.0000 mL | Freq: Three times a day (TID) | ORAL | 0 refills | Status: DC | PRN

## 2017-09-13 MED ORDER — BENZONATATE 100 MG PO CAPS: 100.0000 mg | ORAL_CAPSULE | Freq: Three times a day (TID) | ORAL | 0 refills | Status: DC

## 2017-09-13 MED ORDER — HYDROCHLOROTHIAZIDE 25 MG PO TABS: 25.0000 mg | ORAL_TABLET | Freq: Every day | ORAL | 0 refills | Status: DC

## 2017-09-13 NOTE — ED Triage Notes (Signed)
Pt reports she has had a sore throat with dry cough since Friday. Denies fever.

## 2017-09-13 NOTE — ED Provider Notes (Signed)
[facility] EMERGENCY DEPARTMENT Provider Note   CSN: [id number] Arrival date & time: 09/13/17  1346     History   Chief Complaint Chief Complaint  Patient presents with  . Sore Throat    HPI Sarah Horn is a 26 y.o. female.  HPI  Sarah Horn is a 26 y.o. female who presents to the Emergency Department complaining of sore throat and cough for 4 days.  Cough mostly non-productive.  symptoms began with sore throat.  She has not tried any medications for symptom relief.  She denies chest pain, shortness of breath, fever.  No recent sick contacts.   Past Medical History:  Diagnosis Date  . Irregular menstrual cycle     Patient Active Problem List   Diagnosis Date Noted  . Convulsions/seizures ([place]) 05/23/2014  . Headache(784.0) 05/23/2014  . Obesity, unspecified 05/23/2014  . Amenorrhea 05/16/2014  . Migraine without aura and with status migrainosus, not intractable 05/16/2014  . Gait abnormality 08/28/2013  . S/P left knee arthroscopy 08/20/2013  . Tear of lateral meniscus of left knee 08/20/2013  . KNEE PAIN 09/23/2008  . PATELLAR TENDINITIS 09/23/2008  . TEAR MEDIAL MENISCUS 09/25/2007  . TEAR A C L 09/25/2007    Past Surgical History:  Procedure Laterality Date  . ANTERIOR CRUCIATE LIGAMENT REPAIR     left, [facility]; [facility]    OB History    No data available       Home Medications    Prior to Admission medications   Medication Sig Start Date End Date Taking? Authorizing Provider  norgestimate-ethinyl estradiol (ORTHO-CYCLEN,SPRINTEC,PREVIFEM) 0.25-35 MG-MCG tablet Take 1 tablet by mouth daily. 06/04/14   Nilda Simmer, NP    Family History Family History  Problem Relation Age of Onset  . Migraines Mother   . Anesthesia problems Neg Hx     Social History Social History   Tobacco Use  . Smoking status: Never Smoker  . Smokeless tobacco: Never Used  Substance Use Topics  . Alcohol use: No  . Drug use: No     Allergies     Patient has no known allergies.   Review of Systems Review of Systems  Constitutional: Negative for appetite change, chills and fever.  HENT: Positive for congestion and sore throat. Negative for trouble swallowing.   Respiratory: Positive for cough. Negative for chest tightness, shortness of breath and wheezing.   Cardiovascular: Negative for chest pain.  Gastrointestinal: Negative for abdominal pain, nausea and vomiting.  Genitourinary: Negative for dysuria.  Musculoskeletal: Negative for arthralgias, myalgias, neck pain and neck stiffness.  Skin: Negative for rash.  Neurological: Negative for dizziness, weakness, numbness and headaches.  Hematological: Negative for adenopathy.  All other systems reviewed and are negative.    Physical Exam Updated Vital Signs BP (!) 148/100 (BP Location: Right Arm)   Pulse (!) 101   Temp 98.4 F (36.9 C) (Oral)   Resp 18   Ht 5\' 5"  (1.651 m)   Wt (!) 145.2 kg (320 lb)   LMP 08/13/2017   SpO2 100%   BMI 53.25 kg/m   Physical Exam  Constitutional: She is oriented to person, place, and time. She appears well-developed and well-nourished. No distress.  HENT:  Head: Normocephalic and atraumatic.  Right Ear: Tympanic membrane and ear canal normal.  Left Ear: Tympanic membrane and ear canal normal.  Nose: Mucosal edema and rhinorrhea present.  Mouth/Throat: Uvula is midline and mucous membranes are normal. No trismus in the jaw. No uvula swelling. Posterior  oropharyngeal erythema present. No oropharyngeal exudate, posterior oropharyngeal edema or tonsillar abscesses.  Eyes: Conjunctivae are normal.  Neck: Normal range of motion and phonation normal. Neck supple. No Brudzinski's sign and no Kernig's sign noted.  Cardiovascular: Normal rate, regular rhythm, normal heart sounds and intact distal pulses.  No murmur heard. Pulmonary/Chest: Effort normal. No respiratory distress. She has no wheezes. She has no rales. She exhibits no tenderness.   Course lungs sounds bilaterally.  No rales  Abdominal: Soft. She exhibits no distension. There is no tenderness. There is no rebound and no guarding.  Musculoskeletal: She exhibits no edema.  Lymphadenopathy:    She has no cervical adenopathy.  Neurological: She is alert and oriented to person, place, and time. She exhibits normal muscle tone. Coordination normal.  Skin: Skin is warm and dry.  Nursing note and vitals reviewed.    ED Treatments / Results  Labs (all labs ordered are listed, but only abnormal results are displayed) Labs Reviewed  RAPID STREP SCREEN (NOT AT [facility])  CULTURE, GROUP A STREP [facility])    EKG  EKG Interpretation None       Radiology No results found.  Procedures Procedures (including critical care time)  Medications Ordered in ED Medications  albuterol (PROVENTIL HFA;VENTOLIN HFA) 108 (90 Base) MCG/ACT inhaler 2 puff (2 puffs Inhalation Given 09/13/17 1546)     Initial Impression / Assessment and Plan / ED Course  I have reviewed the triage vital signs and the nursing notes.  Pertinent labs & imaging results that were available during my care of the patient were reviewed by me and considered in my medical decision making (see chart for details).    Today's Vitals   09/13/17 1409 09/13/17 1410 09/13/17 1541 09/13/17 1546  BP:  (!) 152/105 (!) 148/100   Pulse:  (!) 103 (!) 101   Resp:  18 18   Temp:  97.7 F (36.5 C) 98.4 F (36.9 C)   TempSrc:  Oral Oral   SpO2:  99% 100% 100%  Weight: (!) 145.2 kg (320 lb)     Height: 5\' 5"  (1.651 m)     PainSc:           Pt is well appearing.  Remains hypertensive on recheck.  Denies symptoms. Has appt with PCP at end of month.  Will start her on HCTZ temporarily until her appt.    Final Clinical Impressions(s) / ED Diagnoses   Final diagnoses:  Viral URI with cough  Essential hypertension    ED Discharge Orders    None       Kem Parkinson, PA-C 09/15/17 1931    Nat Christen,  MD 09/16/17 [phone]

## 2017-09-13 NOTE — Discharge Instructions (Signed)
1-2 puffs of the inhaler every 4-6 hrs as needed. I have written a prescription for a medication to help lower your blood pressure.  Take it as directed and follow-up with your primary doctor this month.  Return here for any worsening symptoms

## 2017-09-16 LAB — CULTURE, GROUP A STREP (THRC)

## 2017-11-01 HISTORY — PX: LAPAROSCOPIC GASTRIC RESTRICTIVE DUODENAL PROCEDURE (DUODENAL SWITCH): SHX6667

## 2018-03-16 DIAGNOSIS — Z9884 Bariatric surgery status: Secondary | ICD-10-CM | POA: Insufficient documentation

## 2018-08-26 ENCOUNTER — Encounter (HOSPITAL_BASED_OUTPATIENT_CLINIC_OR_DEPARTMENT_OTHER): Payer: Self-pay | Admitting: Emergency Medicine

## 2018-08-26 ENCOUNTER — Emergency Department (HOSPITAL_BASED_OUTPATIENT_CLINIC_OR_DEPARTMENT_OTHER)
Admission: EM | Admit: 2018-08-26 | Discharge: 2018-08-26 | Disposition: A | Discharge status: Home / Self Care | Payer: BC Managed Care – PPO | Attending: Emergency Medicine | Admitting: Emergency Medicine

## 2018-08-26 ENCOUNTER — Other Ambulatory Visit: Payer: Self-pay

## 2018-08-26 DIAGNOSIS — Z202 Contact with and (suspected) exposure to infections with a predominantly sexual mode of transmission: Secondary | ICD-10-CM | POA: Diagnosis not present

## 2018-08-26 DIAGNOSIS — R030 Elevated blood-pressure reading, without diagnosis of hypertension: Secondary | ICD-10-CM

## 2018-08-26 NOTE — ED Triage Notes (Signed)
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 She denies any concerning symptoms.

## 2018-08-26 NOTE — ED Provider Notes (Signed)
[place] EMERGENCY DEPARTMENT Provider Note   CSN: [id number] Arrival date & time: 08/26/18  1433     History   Chief Complaint Chief Complaint  Patient presents with  . Exposure to STD    HPI Sarah Horn is a 27 y.o. female.  Who presents emergency department for syphilis.  Patient was donating plasma with a cousin of hers today when she was told by their staff that she needed to come to the emergency department because she was positive for syphilis.  The patient states that she thinks that she may have gotten it from a gastric bypass surgery because she has never had sexual intercourse.  She states that she has also never had other types of sexual intimacy that contain genital contact and she has never had oral sexual intercourse.  She says that she is and her fianc are waiting until they are married.  She is also never had a pelvic examination.  The patient denies any known symptoms such as rash.  She also has had no vaginal complaints.  HPI  Past Medical History:  Diagnosis Date  . Irregular menstrual cycle     Patient Active Problem List   Diagnosis Date Noted  . Convulsions/seizures ([place]) 05/23/2014  . Headache(784.0) 05/23/2014  . Obesity, unspecified 05/23/2014  . Amenorrhea 05/16/2014  . Migraine without aura and with status migrainosus, not intractable 05/16/2014  . Gait abnormality 08/28/2013  . S/P left knee arthroscopy 08/20/2013  . Tear of lateral meniscus of left knee 08/20/2013  . KNEE PAIN 09/23/2008  . PATELLAR TENDINITIS 09/23/2008  . TEAR MEDIAL MENISCUS 09/25/2007  . TEAR A C L 09/25/2007    Past Surgical History:  Procedure Laterality Date  . ANTERIOR CRUCIATE LIGAMENT REPAIR     left, [facility]; [facility]  . BREAST BIOPSY  06/30/2011   Procedure: BREAST BIOPSY;  Surgeon: Donato Heinz;  Location: [facility]S;  Service: General;  Laterality: Left;  . EXAMINATION UNDER ANESTHESIA Left 08/17/2013   Procedure: EXAM UNDER ANESTHESIA LEFT  KNEE;  Surgeon: Carole Civil, MD;  Location: [facility];  Service: Orthopedics;  Laterality: Left;  . KNEE ARTHROSCOPY WITH LATERAL MENISECTOMY Left 08/17/2013   Procedure: KNEE ARTHROSCOPY WITH PARTIAL LATERAL MENISECTOMY, LIMITED DEBRIDEMENT;  Surgeon: Carole Civil, MD;  Location: [facility];  Service: Orthopedics;  Laterality: Left;     OB History   None      Home Medications    Prior to Admission medications   Medication Sig Start Date End Date Taking? Authorizing Provider  benzonatate (TESSALON) 100 MG capsule Take 1 capsule (100 mg total) every 8 (eight) hours by mouth. 09/13/17   Triplett, Tammy, PA-C  hydrochlorothiazide (HYDRODIURIL) 25 MG tablet Take 1 tablet (25 mg total) daily by mouth. 09/13/17   Triplett, Tammy, PA-C  magic mouthwash w/lidocaine SOLN Take 5 mLs 3 (three) times daily as needed by mouth for mouth pain. Swish and spit, do not swallow 09/13/17   Triplett, Tammy, PA-C  norgestimate-ethinyl estradiol (ORTHO-CYCLEN,SPRINTEC,PREVIFEM) 0.25-35 MG-MCG tablet Take 1 tablet by mouth daily. 06/04/14   Nilda Simmer, NP    Family History Family History  Problem Relation Age of Onset  . Migraines Mother   . Anesthesia problems Neg Hx     Social History Social History   Tobacco Use  . Smoking status: Never Smoker  . Smokeless tobacco: Never Used  Substance Use Topics  . Alcohol use: No  . Drug use: No     Allergies  Patient has no known allergies.   Review of Systems Review of Systems Ten systems reviewed and are negative for acute change, except as noted in the HPI.    Physical Exam Updated Vital Signs BP (!) 135/100 (BP Location: Right Arm)   Pulse 66   Temp 98.4 F (36.9 C) (Oral)   Resp 16   Ht 5\' 5"  (1.651 m)   Wt 108.9 kg   LMP 08/16/2018   SpO2 100%   BMI 39.94 kg/m   Physical Exam  Physical Exam  Nursing note and vitals reviewed. Constitutional: She is oriented to person, place, and time. She appears well-developed and  well-nourished. No distress.  HENT:  Head: Normocephalic and atraumatic.  Eyes: Conjunctivae normal and EOM are normal. Pupils are equal, round, and reactive to light. No scleral icterus.  Neck: Normal range of motion.  Cardiovascular: Normal rate, regular rhythm and normal heart sounds.  Exam reveals no gallop and no friction rub.   No murmur heard. Pulmonary/Chest: Effort normal and breath sounds normal. No respiratory distress.  Abdominal: Soft. Bowel sounds are normal. She exhibits no distension and no mass. There is no tenderness. There is no guarding.  Neurological: She is alert and oriented to person, place, and time.  Skin: Skin is warm and dry. She is not diaphoretic.    ED Treatments / Results  Labs (all labs ordered are listed, but only abnormal results are displayed) Labs Reviewed - No data to display  EKG None  Radiology No results found.  Procedures Procedures (including critical care time)  Medications Ordered in ED Medications - No data to display   Initial Impression / Assessment and Plan / ED Course  I have reviewed the triage vital signs and the nursing notes.  Pertinent labs & imaging results that were available during my care of the patient were reviewed by me and considered in my medical decision making (see chart for details).     27 year old female who was told to come to the emergency department by the plasma center.  Given the patient's presented history have very low suspicion that she has a true positive test.  We have had a shared discussion and the patient will have an HIV and syphilis blood draw today and will follow-up for treatment should our test results be positive as well.  We have and shared decision making decided to forgo GC and Chlamydia testing as it appears to be unnecessary patient appears appropriate for discharge at this time.  Final Clinical Impressions(s) / ED Diagnoses   Final diagnoses:  None    ED Discharge Orders     None       Margarita Mail, PA-C 08/26/18 1553    Julianne Rice, MD 08/27/18 [phone]

## 2018-08-26 NOTE — Discharge Instructions (Signed)
We have sent your HIV and syphilis test to the lab.  It takes about 2 days for these results to turn around.  You may use the MyChart app to check your lab values as soon as they arise.  If your test is positive for either you will be contacted directly by a medical practitioner with [facility] health.   Free HIV and STD Testing These locations offer FREE confidential testing for HIV, Chlamydia, Gonorrhea, and Syphilis. Non-Traditional Testing Sites Address Telephone  [place] [phone] Mondays [place] [address], [place] [phone] Wednesdays 2pm-8pm  [facility] [address] [phone] Thursdays 9am-12noon 1pm-4pm  [facility] [address], [place] [phone] Tuesdays Thursdays 9am-12noon 1pm-4pm  [facility] offers free, confidential testing and treatment for HIV, Chlamydia, Gonorrhea, Syphilis, Herpes, Bacterial Vaginosis, Yeast, and Trichomoniasis. [place] [facility] [facility] [address] [place]  Monday thru Friday  Call for an appointment  [facility]- [facility] [address] [place] [phone] Monday thru Friday  Call for anappointment.  If you have any questions about this information please call [phone]. 09/09/2011'

## 2018-08-28 LAB — HIV ANTIBODY (ROUTINE TESTING W REFLEX): HIV Screen 4th Generation wRfx: NONREACTIVE

## 2018-08-29 LAB — RPR, QUANT+TP ABS (REFLEX)
Rapid Plasma Reagin, Quant: 1:1 {titer} — ABNORMAL HIGH
T Pallidum Abs: NEGATIVE

## 2018-08-29 LAB — RPR: RPR: REACTIVE — AB

## 2018-12-05 ENCOUNTER — Encounter: Payer: Self-pay | Admitting: *Deleted

## 2018-12-19 ENCOUNTER — Encounter: Payer: Self-pay | Admitting: Women's Health

## 2019-06-20 ENCOUNTER — Other Ambulatory Visit: Payer: Self-pay

## 2019-06-20 ENCOUNTER — Emergency Department (HOSPITAL_COMMUNITY): Payer: BC Managed Care – PPO

## 2019-06-20 ENCOUNTER — Emergency Department (HOSPITAL_COMMUNITY)
Admission: EM | Admit: 2019-06-20 | Discharge: 2019-06-20 | Disposition: A | Discharge status: Home / Self Care | Payer: BC Managed Care – PPO | Attending: Emergency Medicine | Admitting: Emergency Medicine

## 2019-06-20 ENCOUNTER — Encounter (HOSPITAL_COMMUNITY): Payer: Self-pay | Admitting: Emergency Medicine

## 2019-06-20 DIAGNOSIS — R1084 Generalized abdominal pain: Secondary | ICD-10-CM | POA: Diagnosis not present

## 2019-06-20 LAB — CBC
HCT: 38.5 % (ref 36.0–46.0)
Hemoglobin: 12.2 g/dL (ref 12.0–15.0)
MCH: 28.3 pg (ref 26.0–34.0)
MCHC: 31.7 g/dL (ref 30.0–36.0)
MCV: 89.3 fL (ref 80.0–100.0)
Platelets: 159 10*3/uL (ref 150–400)
RBC: 4.31 MIL/uL (ref 3.87–5.11)
RDW: 13.3 % (ref 11.5–15.5)
WBC: 8 10*3/uL (ref 4.0–10.5)
nRBC: 0 % (ref 0.0–0.2)

## 2019-06-20 LAB — URINALYSIS, ROUTINE W REFLEX MICROSCOPIC
Bilirubin Urine: NEGATIVE
Glucose, UA: NEGATIVE mg/dL
Hgb urine dipstick: NEGATIVE
Ketones, ur: NEGATIVE mg/dL
Leukocytes,Ua: NEGATIVE
Nitrite: NEGATIVE
Protein, ur: NEGATIVE mg/dL
Specific Gravity, Urine: 1.014 (ref 1.005–1.030)
pH: 6 (ref 5.0–8.0)

## 2019-06-20 LAB — POC URINE PREG, ED: Preg Test, Ur: NEGATIVE

## 2019-06-20 LAB — COMPREHENSIVE METABOLIC PANEL
ALT: 28 U/L (ref 0–44)
AST: 22 U/L (ref 15–41)
Albumin: 3.7 g/dL (ref 3.5–5.0)
Alkaline Phosphatase: 94 U/L (ref 38–126)
Anion gap: 10 (ref 5–15)
BUN: 9 mg/dL (ref 6–20)
CO2: 20 mmol/L — ABNORMAL LOW (ref 22–32)
Calcium: 8.6 mg/dL — ABNORMAL LOW (ref 8.9–10.3)
Chloride: 108 mmol/L (ref 98–111)
Creatinine, Ser: 0.64 mg/dL (ref 0.44–1.00)
GFR calc Af Amer: >60 mL/min (ref >60)
GFR calc non Af Amer: >60 mL/min (ref >60)
Glucose, Bld: 83 mg/dL (ref 70–99)
Potassium: 3.5 mmol/L (ref 3.5–5.1)
Sodium: 138 mmol/L (ref 135–145)
Total Bilirubin: 0.8 mg/dL (ref 0.3–1.2)
Total Protein: 6.9 g/dL (ref 6.5–8.1)

## 2019-06-20 LAB — LIPASE, BLOOD: Lipase: 67 U/L — ABNORMAL HIGH (ref 11–51)

## 2019-06-20 MED ORDER — DICYCLOMINE HCL 20 MG PO TABS: 20.0000 mg | ORAL_TABLET | Freq: Three times a day (TID) | ORAL | 0 refills | Status: DC | PRN

## 2019-06-20 MED ORDER — ONDANSETRON 4 MG PO TBDP: 4.0000 mg | ORAL_TABLET | Freq: Three times a day (TID) | ORAL | 0 refills | Status: DC | PRN

## 2019-06-20 MED ORDER — SODIUM CHLORIDE 0.9% FLUSH: 3.0000 mL | Freq: Once | INTRAVENOUS | Status: DC

## 2019-06-20 MED ORDER — SODIUM CHLORIDE 0.9 % IV BOLUS
1000.0000 mL | Freq: Once | INTRAVENOUS | Status: AC
  Administered 2019-06-20: 1000 mL via INTRAVENOUS

## 2019-06-20 MED ORDER — IOHEXOL 300 MG/ML  SOLN
100.0000 mL | Freq: Once | INTRAMUSCULAR | Status: AC | PRN
  Administered 2019-06-20: 100 mL via INTRAVENOUS

## 2019-06-20 NOTE — ED Provider Notes (Signed)
Emergency Department Provider Note   I have reviewed the triage vital signs and the nursing notes.   HISTORY  Chief Complaint Abdominal Pain   HPI Sarah Horn is a 28 y.o. 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  She describes having 1-2 episodes per week over the last month.  Pain seems to be getting worse with each episode.  She does have some diarrhea along with nausea and vomiting but those symptoms do not occur at the same time as her abdominal pain.  She has not found any obvious provoking or modifying factors.  Pain lasts for several minutes and then resolve spontaneously.  She has tried drinking plenty of fluids with no relief.  She has scheduled a tele-health visit with her surgeon but that is next month.  She had a severe episode of pain today which prompted her ED evaluation. Denies fever.   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[place]) 05/23/2014   Headache(784.0) 05/23/2014   Obesity, unspecified 05/23/2014   Amenorrhea 05/16/2014   Migraine without aura and with status migrainosus, not intractable 05/16/2014   Gait abnormality 08/28/2013   S/P left knee arthroscopy 08/20/2013   Tear of lateral meniscus of left knee 08/20/2013   KNEE PAIN 09/23/2008   PATELLAR TENDINITIS 09/23/2008   TEAR MEDIAL MENISCUS 09/25/2007   TEAR A C L 09/25/2007    Past Surgical History:  Procedure Laterality Date   ANTERIOR CRUCIATE LIGAMENT REPAIR     left, [facility]; [place]   BREAST BIOPSY  06/30/2011   Procedure: BREAST BIOPSY;  Surgeon: Donato Heinz;  Location: [facility];  Service: General;  Laterality: Left;   EXAMINATION UNDER ANESTHESIA Left 08/17/2013   Procedure: EXAM  UNDER ANESTHESIA LEFT KNEE;  Surgeon: Carole Civil, MD;  Location: [facility];  Service: Orthopedics;  Laterality: Left;   KNEE ARTHROSCOPY WITH LATERAL MENISECTOMY Left 08/17/2013   Procedure: KNEE ARTHROSCOPY WITH PARTIAL LATERAL MENISECTOMY, LIMITED DEBRIDEMENT;  Surgeon: Carole Civil, MD;  Location: [facility];  Service: Orthopedics;  Laterality: Left;   LAPAROSCOPIC GASTRIC RESTRICTIVE DUODENAL PROCEDURE (DUODENAL SWITCH)      Allergies Patient has no known allergies.  Family History  Problem Relation Age of Onset   Migraines Mother    Anesthesia problems Neg Hx     Social History Social History   Tobacco Use   Smoking status: Never Smoker   Smokeless tobacco: Never Used  Substance Use Topics   Alcohol use: No   Drug use: No    Review of Systems  Constitutional: No fever/chills Eyes: No visual changes. ENT: No sore throat. Cardiovascular: Denies chest pain. Respiratory: Denies shortness of breath. Gastrointestinal: Positive intermittent abdominal pain.  No nausea, no vomiting.  Occasional diarrhea.  No constipation. Genitourinary: Negative for dysuria. Musculoskeletal: Negative for back pain. Skin: Negative for rash. Neurological: Negative for headaches, focal weakness or numbness.  10-point ROS otherwise negative.  ____________________________________________   PHYSICAL EXAM:  VITAL SIGNS: ED Triage Vitals  Enc Vitals Group     BP 06/20/19 1824 (!) 139/91     Pulse Rate 06/20/19 1824 73     Resp 06/20/19 1824 14     Temp 06/20/19 1824 98.8 F (37.1 C)  Temp Source 06/20/19 1824 Oral     SpO2 06/20/19 1824 100 %     Pain Score 06/20/19 1825 0   Constitutional: Alert and oriented. Well appearing and in no acute distress. Eyes: Conjunctivae are normal.  Head: Atraumatic. Nose: No congestion/rhinnorhea. Mouth/Throat: Mucous membranes are moist. Neck: No stridor. Cardiovascular: Normal rate, regular rhythm. Good peripheral circulation.  Grossly normal heart sounds.   Respiratory: Normal respiratory effort.  No retractions. Lungs CTAB. Gastrointestinal: Soft and nontender. No distention.  Musculoskeletal: No lower extremity tenderness nor edema. No gross deformities of extremities. Neurologic:  Normal speech and language. No gross focal neurologic deficits are appreciated.  Skin:  Skin is warm, dry and intact. No rash noted.  ____________________________________________   LABS (all labs ordered are listed, but only abnormal results are displayed)  Labs Reviewed  LIPASE, BLOOD - Abnormal; Notable for the following components:      Result Value   Lipase 67 (*)    All other components within normal limits  COMPREHENSIVE METABOLIC PANEL - Abnormal; Notable for the following components:   CO2 20 (*)    Calcium 8.6 (*)    All other components within normal limits  CBC  URINALYSIS, ROUTINE W REFLEX MICROSCOPIC  POC URINE PREG, ED   ____________________________________________  RADIOLOGY  CT imaging preliminary read reviewed.  ____________________________________________   PROCEDURES  Procedure(s) performed:   Procedures  None ____________________________________________   INITIAL IMPRESSION / ASSESSMENT AND PLAN / ED COURSE  Pertinent labs & imaging results that were available during my care of the patient were reviewed by me and considered in my medical decision making (see chart for details).   Patient presents to the emergency department for evaluation of intermittent severe abdominal cramping.  Triage note describes some nausea vomiting.  When I discussed this with the patient she is not experiencing this.  She has experiencing occasional diarrhea which is nonbloody.  Abdomen is diffusely soft and nontender.  Given her surgery in the last 15 months I do plan to obtain a CT scan of the abdomen and pelvis to rule out internal hernia versus volvulus.  Patient does not have obstruction symptoms.   Patient's  preliminary CT read has come across.  The detailed report is not available due to a technical issue with radiology.  The preliminary read shows nonspecific gaseous distention proximal to the surgical site.  Question partial versus early obstruction.  Patient is not having nausea vomiting.  She is only having intermittent abdominal pain and no pain currently.  I do not suspect obstruction clinically.  Patient is eating and drinking without difficulty.  We discussed the CT findings.  She plans to call her surgery team tomorrow morning to arrange close follow-up and return to the emergency department should her symptoms worsen.    ____________________________________________  FINAL CLINICAL IMPRESSION(S) / ED DIAGNOSES  Final diagnoses:  Generalized abdominal pain     MEDICATIONS GIVEN DURING THIS VISIT:  Medications  sodium chloride 0.9 % bolus 1,000 mL (0 mLs Intravenous Stopped 06/20/19 2257)  iohexol (OMNIPAQUE) 300 MG/ML solution 100 mL (100 mLs Intravenous Contrast Given 06/20/19 2131)     NEW OUTPATIENT MEDICATIONS STARTED DURING THIS VISIT:  Discharge Medication List as of 06/20/2019 10:52 PM    START taking these medications   Details  dicyclomine (BENTYL) 20 MG tablet Take 1 tablet (20 mg total) by mouth 3 (three) times daily as needed for spasms (abdominal cramping)., Starting Wed 06/20/2019, Print    ondansetron (ZOFRAN ODT) 4 MG  disintegrating tablet Take 1 tablet (4 mg total) by mouth every 8 (eight) hours as needed for nausea., Starting Wed 06/20/2019, Print        Note:  This document was prepared using Dragon voice recognition software and may include unintentional dictation errors.  Nanda Quinton, MD Emergency Medicine    Carlester Kasparek, Wonda Olds, MD 06/21/19 1311

## 2019-06-20 NOTE — Discharge Instructions (Signed)
Your CT scan today showed nonspecific findings but does suggest that you may be having some symptoms from your surgery.  Please call your surgeon tomorrow morning to discuss your symptoms and arrange a follow-up appointment.  If you develop sudden worsening pain or vomiting he should return to the emergency department immediately.

## 2019-06-20 NOTE — ED Notes (Signed)
Urine pregnancy test was negative.

## 2019-06-20 NOTE — ED Triage Notes (Signed)
Patient reports she had weight loss surgery 17 months ago. Over the past month she has started having episodes of abdominal cramping with intermittent diarrhea and vomiting. Patient states today the cramping is worse. Has had watery diarrhea after the cramping.

## 2019-07-24 ENCOUNTER — Other Ambulatory Visit: Payer: Self-pay | Admitting: Otolaryngology

## 2019-07-30 ENCOUNTER — Encounter (HOSPITAL_BASED_OUTPATIENT_CLINIC_OR_DEPARTMENT_OTHER): Payer: Self-pay | Admitting: *Deleted

## 2019-07-30 ENCOUNTER — Other Ambulatory Visit: Payer: Self-pay

## 2019-08-02 ENCOUNTER — Other Ambulatory Visit (HOSPITAL_COMMUNITY): Admission: RE | Admit: 2019-08-02 | Payer: BC Managed Care – PPO | Source: Ambulatory Visit

## 2019-08-02 ENCOUNTER — Other Ambulatory Visit: Payer: Self-pay

## 2019-08-02 NOTE — Progress Notes (Signed)
Spoke with patient over the phone, patient stated that she tested positive for COVID on 9/29.  Patient's surgeyr has been rescheduled for 11/3.  Patient will not need to be retested for COVID before her surgery on 11/3

## 2019-08-31 ENCOUNTER — Other Ambulatory Visit (HOSPITAL_COMMUNITY): Admission: RE | Admit: 2019-08-31 | Payer: BC Managed Care – PPO | Source: Ambulatory Visit

## 2019-09-04 ENCOUNTER — Ambulatory Visit (HOSPITAL_BASED_OUTPATIENT_CLINIC_OR_DEPARTMENT_OTHER): Admission: RE | Admit: 2019-09-04 | Payer: BC Managed Care – PPO | Source: Home / Self Care | Admitting: Otolaryngology

## 2019-09-04 HISTORY — DX: Sleep apnea, unspecified: G47.30

## 2019-09-04 SURGERY — TONSILLECTOMY AND ADENOIDECTOMY
Anesthesia: General

## 2020-11-08 IMAGING — CT CT ABDOMEN AND PELVIS WITH CONTRAST
2 of 4 series · 16 of 46 positions shown, 18 images · IV contrast (Isovue)
Comparison: None.

CLINICAL DATA: Abdominal pain, history of duodenal switch,
worsening cramping

EXAM:
CT ABDOMEN AND PELVIS WITH CONTRAST
TECHNIQUE: Multidetector CT imaging of the abdomen and pelvis was performed
using the standard protocol following bolus administration of
intravenous contrast.
CONTRAST:  100mL OMNIPAQUE IOHEXOL 300 MG/ML  SOLN

[Series 2: axial st · axial · 0.98mm/px · z∈[-500,-75]mm · 13 of 96 slices shown, 15 images]
[im 6/96  soft-tissue]
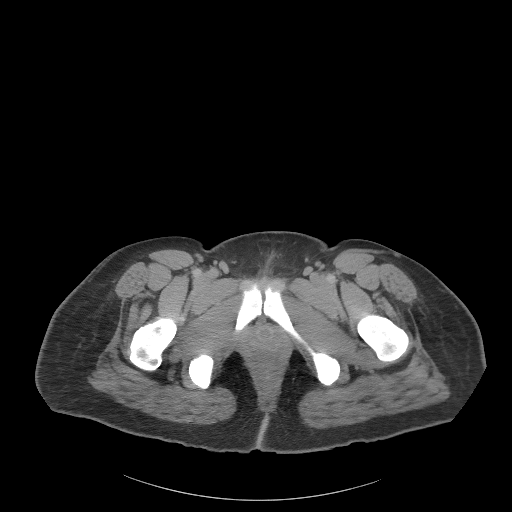
[im 6/96  bone]
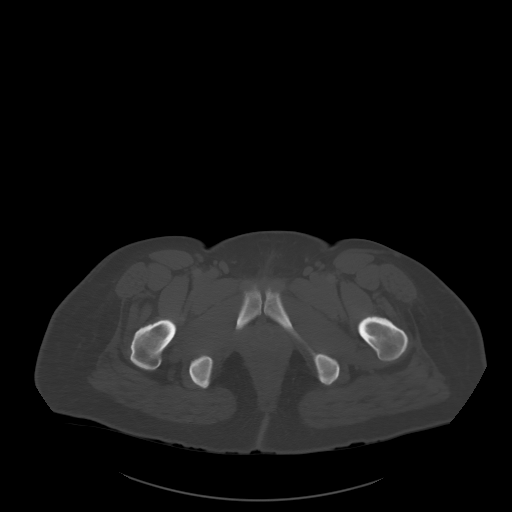
[im 16/96  soft-tissue]
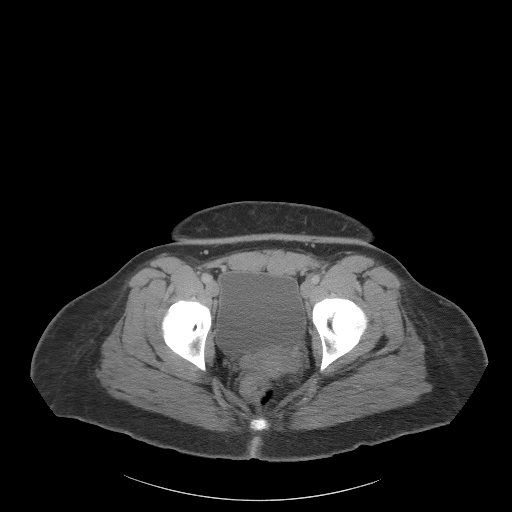
[im 21/96  soft-tissue]
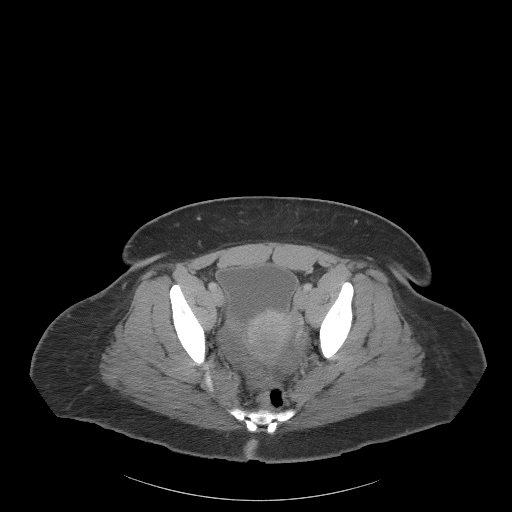
[im 26/96  soft-tissue]
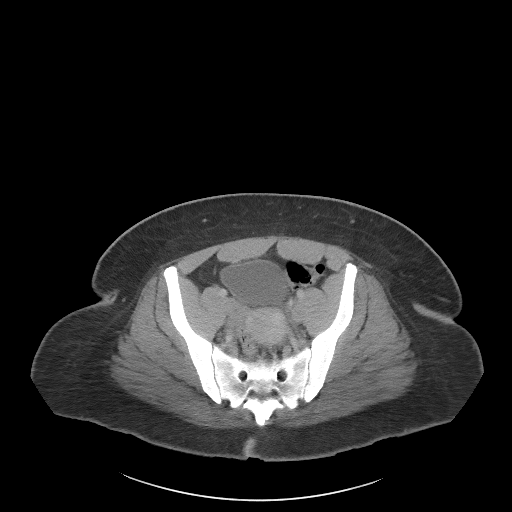
[im 36/96  soft-tissue]
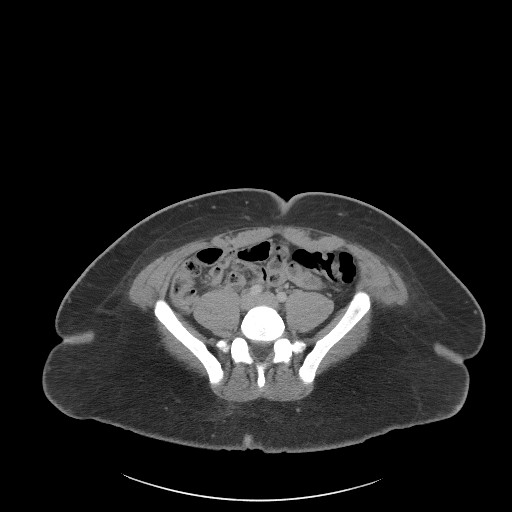
[im 41/96  soft-tissue]
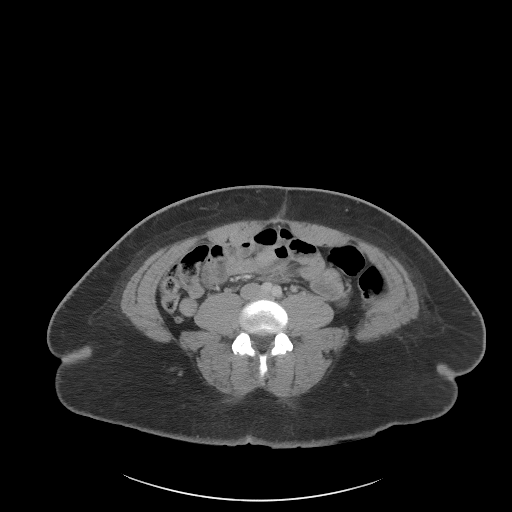
[im 51/96  soft-tissue]
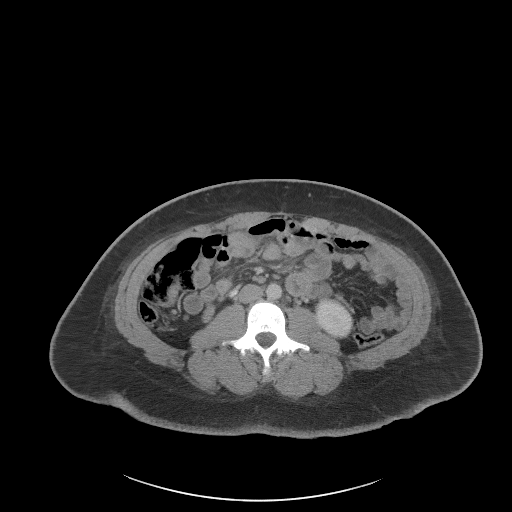
[im 56/96  soft-tissue]
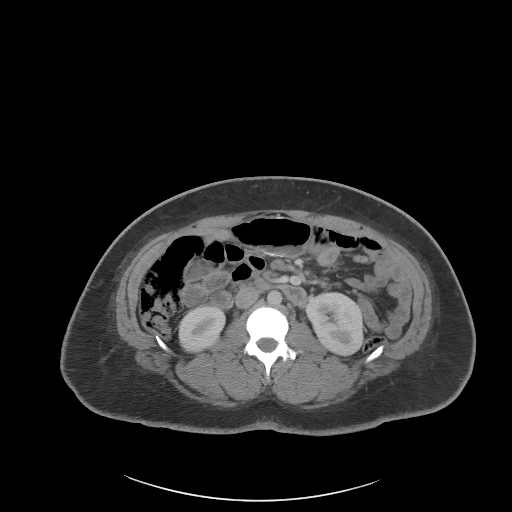
[im 61/96  soft-tissue]
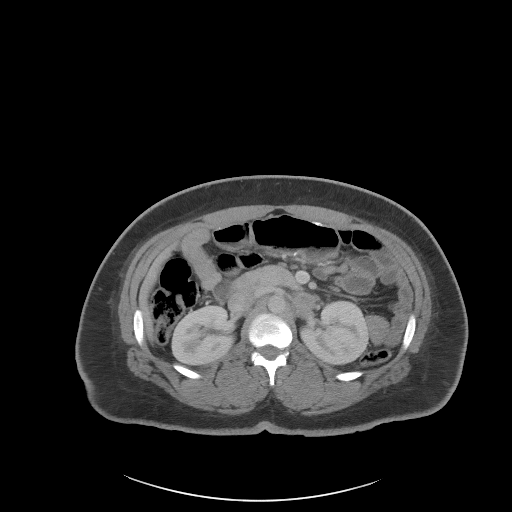
[im 61/96  bone]
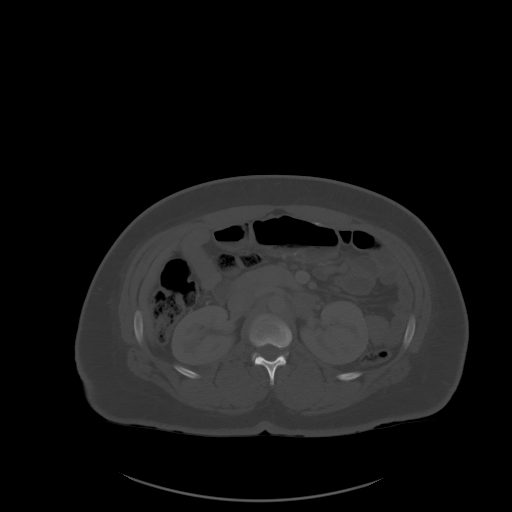
[im 71/96  soft-tissue]
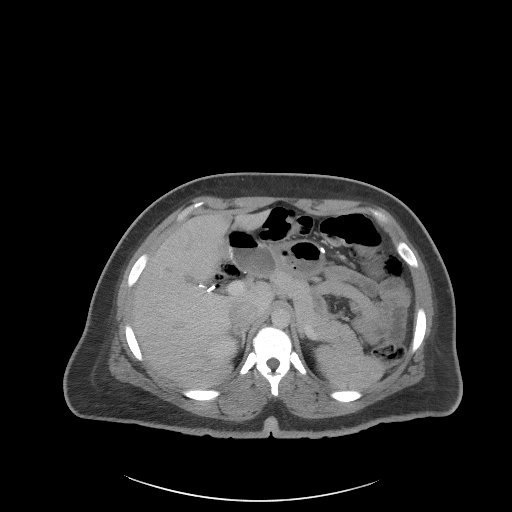
[im 76/96  soft-tissue]
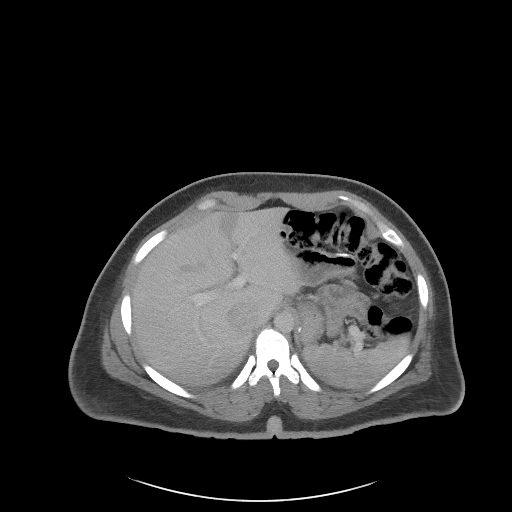
[im 81/96  soft-tissue]
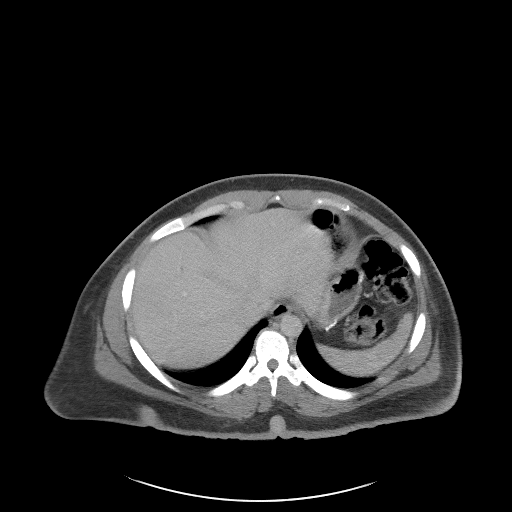
[im 91/96  soft-tissue]
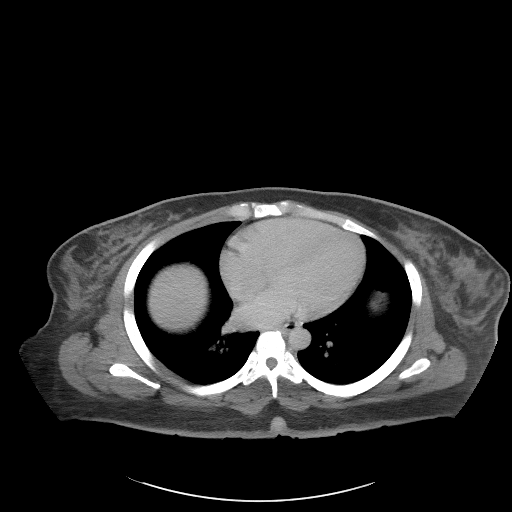

[Series 5: coronal st · coronal · 0.83mm/px · 3 of 86 slices shown]
[im 29/86  soft-tissue]
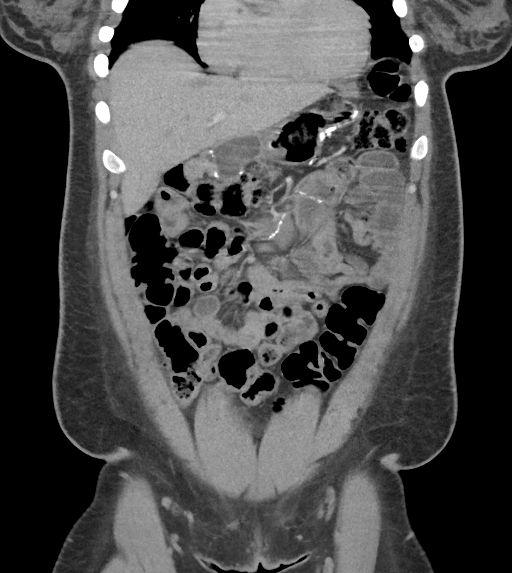
[im 38/86  soft-tissue]
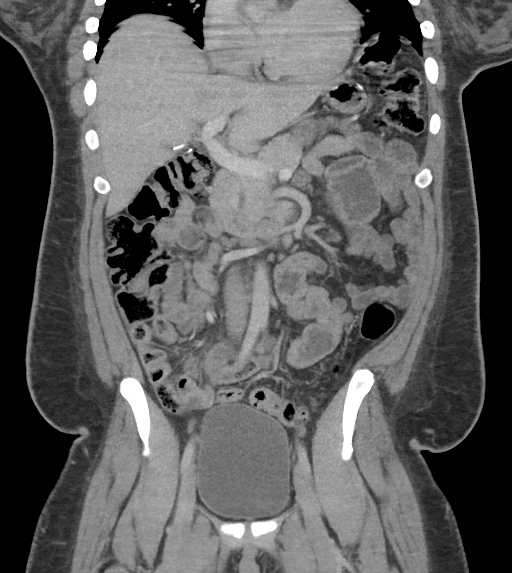
[im 48/86  soft-tissue]
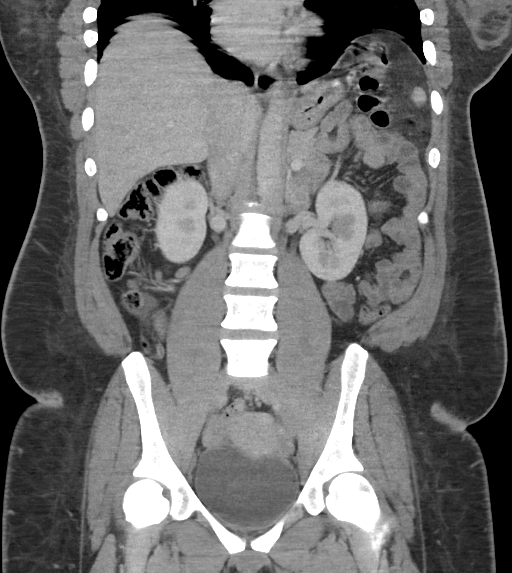

[16 of 46 positions shown; findings below may reference images not displayed]

FINDINGS: Lower chest: Bibasilar atelectatic changes. Normal heart size. No
pericardial effusion.

Hepatobiliary: Geographic hypoattenuation along the falciform
ligament likely reflects focal fatty infiltration. No concerning
liver abnormality is seen. Patient is post cholecystectomy. Slight
prominence of the biliary tree likely related to reservoir effect.
No calcified intraductal gallstones.

Pancreas: Unremarkable. No pancreatic ductal dilatation or
surrounding inflammatory changes.

Spleen: Normal in size without focal abnormality.

Adrenals/Urinary Tract: Adrenal glands are unremarkable. Kidneys are
normal, without renal calculi, focal lesion, or hydronephrosis.
Bladder is unremarkable.

Stomach/Bowel: There are postsurgical changes from a duodenal switch
procedure. Intact suture line seen along the greater curvature of
the stomach. There is nonspecific gaseous distention of the proximal
mild injury limb, just proximal to the anastomosis which forms the
common channel. More distal small bowel is unremarkable. A normal
appendix is visualized. No colonic dilatation or wall thickening.

Vascular/Lymphatic: No significant vascular findings are present. No
enlarged abdominal or pelvic lymph nodes.

Reproductive: Normal appearance of the uterus and adnexal
structures.

Other: No abdominopelvic free fluid or free gas. No bowel containing
hernias.

Musculoskeletal: No acute or significant osseous findings.
IMPRESSION: 1. Postsurgical changes from a duodenal switch procedure.
2. Nonspecific gaseous distention of the proximal mild injury limb,
just proximal to the anastomosis which forms the common channel.
Could reflect a partial/low grade obstruction or stenosis at the
anastomotic site. No extraluminal gas or adjacent inflammatory
features.

## 2021-05-05 ENCOUNTER — Encounter (HOSPITAL_COMMUNITY): Payer: Self-pay

## 2021-05-05 ENCOUNTER — Emergency Department (HOSPITAL_COMMUNITY)
Admission: EM | Admit: 2021-05-05 | Discharge: 2021-05-05 | Disposition: A | Discharge status: Home / Self Care | Payer: Commercial Managed Care - PPO | Attending: Emergency Medicine | Admitting: Emergency Medicine

## 2021-05-05 ENCOUNTER — Other Ambulatory Visit: Payer: Self-pay

## 2021-05-05 DIAGNOSIS — R102 Pelvic and perineal pain: Secondary | ICD-10-CM | POA: Diagnosis present

## 2021-05-05 DIAGNOSIS — N764 Abscess of vulva: Secondary | ICD-10-CM

## 2021-05-05 MED ORDER — HYDROMORPHONE HCL 1 MG/ML IJ SOLN
1.0000 mg | Freq: Once | INTRAMUSCULAR | Status: AC
  Administered 2021-05-05: 1 mg via INTRAMUSCULAR
  Filled 2021-05-05: qty 1

## 2021-05-05 MED ORDER — DOXYCYCLINE HYCLATE 100 MG PO CAPS
100.0000 mg | ORAL_CAPSULE | Freq: Two times a day (BID) | ORAL | 0 refills | Status: DC
Start: 2021-05-05 — End: 2022-08-11

## 2021-05-05 MED ORDER — MIDAZOLAM HCL (PF) 10 MG/2ML IJ SOLN
1.0000 mg | Freq: Once | INTRAMUSCULAR | Status: AC
  Administered 2021-05-05: 1 mg via INTRAMUSCULAR
  Filled 2021-05-05: qty 2

## 2021-05-05 MED ORDER — POVIDONE-IODINE 5 % EX SOLN
Freq: Once | CUTANEOUS | Status: AC
  Filled 2021-05-05: qty 88.7

## 2021-05-05 MED ORDER — LIDOCAINE-EPINEPHRINE (PF) 2 %-1:200000 IJ SOLN
20.0000 mL | Freq: Once | INTRAMUSCULAR | Status: AC
  Administered 2021-05-05: 20 mL
  Filled 2021-05-05: qty 20

## 2021-05-05 MED ORDER — METRONIDAZOLE 500 MG PO TABS: 500.0000 mg | ORAL_TABLET | Freq: Two times a day (BID) | ORAL | 0 refills | Status: DC

## 2021-05-05 MED ORDER — METRONIDAZOLE 500 MG PO TABS
500.0000 mg | ORAL_TABLET | Freq: Once | ORAL | Status: AC
  Administered 2021-05-05: 500 mg via ORAL
  Filled 2021-05-05: qty 1

## 2021-05-05 MED ORDER — POVIDONE-IODINE 10 % EX SOLN
CUTANEOUS | Status: AC
  Filled 2021-05-05: qty 15

## 2021-05-05 MED ORDER — DOXYCYCLINE HYCLATE 100 MG PO TABS
100.0000 mg | ORAL_TABLET | Freq: Once | ORAL | Status: AC
  Administered 2021-05-05: 100 mg via ORAL
  Filled 2021-05-05: qty 1

## 2021-05-05 NOTE — Discharge Instructions (Addendum)
Soak the area in warm tub 2 or 3 times daily.  If the catheter falls out, it is okay to stay out.  Take the antibiotics as prescribed.  Follow-up with a gynecologist for recheck in 2 days.  Return to the ED with new or worsening symptoms.

## 2021-05-05 NOTE — ED Notes (Signed)
ED Provider at bedside. 

## 2021-05-05 NOTE — ED Triage Notes (Signed)
Pt c/o large knot on vaginal area. States she noticed it this morning.

## 2021-05-05 NOTE — ED Provider Notes (Signed)
[facility] EMERGENCY DEPARTMENT Provider Note   CSN: [id number] Arrival date & time: 05/05/21  0534     History Chief Complaint  Patient presents with   Abscess    Sarah Horn is a 30 y.o. female.  Patient complains of discomfort when wiping this morning after using the bathroom.  She describes a " lump or boil" to her vagina that she noticed this morning that is painful when she wipes.  No bleeding or drainage she is on her menstrual cycle.  Did not have any issues yesterday.  No abdominal pain, fever or vomiting.  No back pain, chest pain or shortness of breath. Never had this issue before.  Last seen 3 weeks ago. Denies possibility of pregnancy  The history is provided by the patient.  Abscess Associated symptoms: no fever, no headaches, no nausea and no vomiting       Past Medical History:  Diagnosis Date   Irregular menstrual cycle    Sleep apnea    resolved with weigh loss surgery    Patient Active Problem List   Diagnosis Date Noted   Convulsions/seizures ([place]) 05/23/2014   Headache(784.0) 05/23/2014   Obesity, unspecified 05/23/2014   Amenorrhea 05/16/2014   Migraine without aura and with status migrainosus, not intractable 05/16/2014   Gait abnormality 08/28/2013   S/P left knee arthroscopy 08/20/2013   Tear of lateral meniscus of left knee 08/20/2013   KNEE PAIN 09/23/2008   PATELLAR TENDINITIS 09/23/2008   TEAR MEDIAL MENISCUS 09/25/2007   TEAR A C L 09/25/2007    Past Surgical History:  Procedure Laterality Date   ANTERIOR CRUCIATE LIGAMENT REPAIR     left, [facility]; [place]   BREAST BIOPSY  06/30/2011   Procedure: BREAST BIOPSY;  Surgeon: Donato Heinz;  Location: [facility];  Service: General;  Laterality: Left;   EXAMINATION UNDER ANESTHESIA Left 08/17/2013   Procedure: EXAM UNDER ANESTHESIA LEFT KNEE;  Surgeon: Carole Civil, MD;  Location: [facility];  Service: Orthopedics;  Laterality: Left;   KNEE ARTHROSCOPY WITH LATERAL MENISECTOMY Left  08/17/2013   Procedure: KNEE ARTHROSCOPY WITH PARTIAL LATERAL MENISECTOMY, LIMITED DEBRIDEMENT;  Surgeon: Carole Civil, MD;  Location: [facility];  Service: Orthopedics;  Laterality: Left;   LAPAROSCOPIC GASTRIC RESTRICTIVE DUODENAL PROCEDURE (DUODENAL SWITCH)       OB History     Gravida      Para      Term      Preterm      AB      Living  0      SAB      IAB      Ectopic      Multiple      Live Births              Family History  Problem Relation Age of Onset   Migraines Mother    Anesthesia problems Neg Hx     Social History   Tobacco Use   Smoking status: Never   Smokeless tobacco: Never  Vaping Use   Vaping Use: Former  Substance Use Topics   Alcohol use: No   Drug use: No    Home Medications Prior to Admission medications   Medication Sig Start Date End Date Taking? Authorizing Provider  dicyclomine (BENTYL) 20 MG tablet Take 1 tablet (20 mg total) by mouth 3 (three) times daily as needed for spasms (abdominal cramping). 06/20/19   Long, Wonda Olds, MD  ondansetron (ZOFRAN ODT) 4 MG disintegrating tablet  Take 1 tablet (4 mg total) by mouth every 8 (eight) hours as needed for nausea. 06/20/19   Long, Wonda Olds, MD    Allergies    Patient has no known allergies.  Review of Systems   Review of Systems  Constitutional:  Negative for activity change, appetite change and fever.  HENT:  Negative for congestion.   Respiratory:  Negative for cough, chest tightness and shortness of breath.   Cardiovascular:  Negative for chest pain.  Gastrointestinal:  Negative for abdominal pain, nausea and vomiting.  Genitourinary:  Negative for decreased urine volume, dysuria and hematuria.  Musculoskeletal:  Negative for arthralgias and myalgias.  Skin:  Positive for wound.  Neurological:  Negative for dizziness, weakness and headaches.   all other systems are negative except as noted in the HPI and PMH.   Physical Exam Updated Vital Signs BP (!) 141/97    Pulse 77   Temp 98.7 F (37.1 C) (Oral)   Resp 18   Ht 5\' 4"  (1.626 m)   Wt 95.3 kg   LMP 04/30/2021 (Exact Date)   SpO2 100%   BMI 36.05 kg/m   Physical Exam Vitals and nursing note reviewed.  Constitutional:      General: She is not in acute distress.    Appearance: She is well-developed.  HENT:     Head: Normocephalic and atraumatic.     Mouth/Throat:     Pharynx: No oropharyngeal exudate.  Eyes:     Conjunctiva/sclera: Conjunctivae normal.     Pupils: Pupils are equal, round, and reactive to light.  Neck:     Comments: No meningismus. Cardiovascular:     Rate and Rhythm: Normal rate and regular rhythm.     Heart sounds: Normal heart sounds. No murmur heard. Pulmonary:     Effort: Pulmonary effort is normal. No respiratory distress.     Breath sounds: Normal breath sounds.  Abdominal:     Palpations: Abdomen is soft.     Tenderness: There is no abdominal tenderness. There is no guarding or rebound.  Genitourinary:    Comments: Chaperone present Maci RN. There is a 1 cm area of induration and fluctuance to the inferior aspect of the left labia majora. Musculoskeletal:        General: No tenderness. Normal range of motion.     Cervical back: Normal range of motion and neck supple.  Skin:    General: Skin is warm.  Neurological:     Mental Status: She is alert and oriented to person, place, and time.     Cranial Nerves: No cranial nerve deficit.     Motor: No abnormal muscle tone.     Coordination: Coordination normal.     Comments:  5/5 strength throughout. CN 2-12 intact.Equal grip strength.   Psychiatric:        Behavior: Behavior normal.    ED Results / Procedures / Treatments   Labs (all labs ordered are listed, but only abnormal results are displayed) Labs Reviewed - No data to display  EKG None  Radiology No results found.  Procedures .Marland KitchenIncision and Drainage  Date/Time: 05/05/2021 6:54 AM Performed by: Ezequiel Essex, MD Authorized by:  Ezequiel Essex, MD   Consent:    Consent obtained:  Verbal   Consent given by:  Patient   Risks, benefits, and alternatives were discussed: yes     Risks discussed:  Bleeding, damage to other organs, infection, incomplete drainage and pain   Alternatives discussed:  No treatment Universal protocol:  Procedure explained and questions answered to patient or proxy's satisfaction: yes     Immediately prior to procedure, a time out was called: yes     Patient identity confirmed:  Hospital-assigned identification number and verbally with patient Location:    Type:  Abscess   Size:  1   Location:  Anogenital   Anogenital location:  [place] Pre-procedure details:    Skin preparation:  Povidone-iodine Sedation:    Sedation type:  Anxiolysis Anesthesia:    Anesthesia method:  Local infiltration   Local anesthetic:  Lidocaine 2% WITH epi Procedure type:    Complexity:  Simple Procedure details:    Needle aspiration: no     Incision types:  Single straight   Incision depth:  Subcutaneous   Wound management:  Probed and deloculated and irrigated with saline   Drainage:  Purulent   Drainage amount:  Moderate   Wound treatment:  Drain placed   Packing materials:  Word catheter Post-procedure details:    Procedure completion:  Tolerated well, no immediate complications   Medications Ordered in ED Medications  lidocaine-EPINEPHrine (XYLOCAINE W/EPI) 2 %-1:200000 (PF) injection 20 mL (has no administration in time range)  povidone-Iodine (BETADINE) 5 % topical solution (has no administration in time range)  povidone-iodine (BETADINE) 10 % external solution (has no administration in time range)    ED Course  I have reviewed the triage vital signs and the nursing notes.  Pertinent labs & imaging results that were available during my care of the patient were reviewed by me and considered in my medical decision making (see chart for details).    MDM Rules/Calculators/A&P                          Vaginal abscess.  Will need incision and drainage.  Vaginal abscess, possible Bartholin's abscess.  Incision and drainage performed as above with assistance of Maci RN.  Patient tolerated well. Word catheter placed. We discussed warm soaks, antibiotics and gynecology follow-up.  Return precautions discussed Final Clinical Impression(s) / ED Diagnoses Final diagnoses:  Vulvar abscess    Rx / DC Orders ED Discharge Orders     None        Jarelis Ehlert, Annie Main, MD 05/05/21 [id number]

## 2021-05-21 ENCOUNTER — Ambulatory Visit: Payer: Commercial Managed Care - PPO | Admitting: Obstetrics & Gynecology

## 2021-08-24 ENCOUNTER — Other Ambulatory Visit: Payer: Self-pay

## 2021-08-24 ENCOUNTER — Encounter (HOSPITAL_COMMUNITY): Payer: Self-pay | Admitting: *Deleted

## 2021-08-24 ENCOUNTER — Emergency Department (HOSPITAL_COMMUNITY)
Admission: EM | Admit: 2021-08-24 | Discharge: 2021-08-25 | Disposition: A | Discharge status: Home / Self Care | Payer: Commercial Managed Care - PPO | Attending: Emergency Medicine | Admitting: Emergency Medicine

## 2021-08-24 DIAGNOSIS — R0981 Nasal congestion: Secondary | ICD-10-CM | POA: Diagnosis not present

## 2021-08-24 DIAGNOSIS — R112 Nausea with vomiting, unspecified: Secondary | ICD-10-CM | POA: Diagnosis not present

## 2021-08-24 DIAGNOSIS — B349 Viral infection, unspecified: Secondary | ICD-10-CM

## 2021-08-24 DIAGNOSIS — Z20822 Contact with and (suspected) exposure to covid-19: Secondary | ICD-10-CM | POA: Diagnosis not present

## 2021-08-24 DIAGNOSIS — R059 Cough, unspecified: Secondary | ICD-10-CM | POA: Diagnosis not present

## 2021-08-24 DIAGNOSIS — R197 Diarrhea, unspecified: Secondary | ICD-10-CM | POA: Diagnosis not present

## 2021-08-24 DIAGNOSIS — J029 Acute pharyngitis, unspecified: Secondary | ICD-10-CM | POA: Diagnosis not present

## 2021-08-24 NOTE — ED Triage Notes (Signed)
Pt with cough, abd pain and N/V/D.

## 2021-08-25 LAB — CBC
HCT: 37.3 % (ref 36.0–46.0)
Hemoglobin: 11.9 g/dL — ABNORMAL LOW (ref 12.0–15.0)
MCH: 29.3 pg (ref 26.0–34.0)
MCHC: 31.9 g/dL (ref 30.0–36.0)
MCV: 91.9 fL (ref 80.0–100.0)
Platelets: 133 10*3/uL — ABNORMAL LOW (ref 150–400)
RBC: 4.06 MIL/uL (ref 3.87–5.11)
RDW: 13.7 % (ref 11.5–15.5)
WBC: 5.7 10*3/uL (ref 4.0–10.5)
nRBC: 0 % (ref 0.0–0.2)

## 2021-08-25 LAB — COMPREHENSIVE METABOLIC PANEL
ALT: 20 U/L (ref 0–44)
AST: 20 U/L (ref 15–41)
Albumin: 3.9 g/dL (ref 3.5–5.0)
Alkaline Phosphatase: 65 U/L (ref 38–126)
Anion gap: 5 (ref 5–15)
BUN: 7 mg/dL (ref 6–20)
CO2: 25 mmol/L (ref 22–32)
Calcium: 8.5 mg/dL — ABNORMAL LOW (ref 8.9–10.3)
Chloride: 108 mmol/L (ref 98–111)
Creatinine, Ser: 0.64 mg/dL (ref 0.44–1.00)
GFR, Estimated: >60 mL/min (ref >60)
Glucose, Bld: 89 mg/dL (ref 70–99)
Potassium: 3.8 mmol/L (ref 3.5–5.1)
Sodium: 138 mmol/L (ref 135–145)
Total Bilirubin: 0.8 mg/dL (ref 0.3–1.2)
Total Protein: 7 g/dL (ref 6.5–8.1)

## 2021-08-25 LAB — LIPASE, BLOOD: Lipase: 29 U/L (ref 11–51)

## 2021-08-25 LAB — RESP PANEL BY RT-PCR (FLU A&B, COVID) ARPGX2
Influenza A by PCR: NEGATIVE
Influenza B by PCR: NEGATIVE
SARS Coronavirus 2 by RT PCR: NEGATIVE

## 2021-08-25 MED ORDER — SODIUM CHLORIDE 0.9 % IV BOLUS
1000.0000 mL | Freq: Once | INTRAVENOUS | Status: AC
  Administered 2021-08-25: 1000 mL via INTRAVENOUS

## 2021-08-25 MED ORDER — ONDANSETRON 8 MG PO TBDP: ORAL_TABLET | ORAL | 0 refills | Status: DC

## 2021-08-25 MED ORDER — ONDANSETRON HCL 4 MG/2ML IJ SOLN
4.0000 mg | Freq: Once | INTRAMUSCULAR | Status: AC
  Administered 2021-08-25: 4 mg via INTRAVENOUS
  Filled 2021-08-25: qty 2

## 2021-08-25 MED ORDER — KETOROLAC TROMETHAMINE 30 MG/ML IJ SOLN
30.0000 mg | Freq: Once | INTRAMUSCULAR | Status: AC
  Administered 2021-08-25: 30 mg via INTRAVENOUS
  Filled 2021-08-25: qty 1

## 2021-08-25 NOTE — Discharge Instructions (Signed)
Begin taking Zofran as prescribed as needed for nausea.  Clear liquid diet for the next 12 hours, then slowly advance to normal as tolerated.  Return to the emergency department if symptoms significantly worsen or change.

## 2021-08-25 NOTE — ED Provider Notes (Signed)
[facility] EMERGENCY DEPARTMENT Provider Note   CSN: [id number] Arrival date & time: 08/24/21  1957     History Chief Complaint  Patient presents with   Cough    Sarah Horn is a 30 y.o. female.  Patient is a 30 year old female with past medical history of seizures.  She presents today for evaluation of cough, congestion, sore throat, nausea, vomiting, and diarrhea.  This is been ongoing since yesterday.  She denies any bloody sputum or stool.  She denies any fevers, but has felt chilled.  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  The history is provided by the patient.      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   Convulsions/seizures ([place]) 05/23/2014   Headache(784.0) 05/23/2014   Morbid obesity ([place]) 05/23/2014   Amenorrhea 05/16/2014   Migraine without aura and with status migrainosus, not intractable 05/16/2014   Gait abnormality 08/28/2013   S/P left knee arthroscopy 08/20/2013   Tear of lateral meniscus of left knee 08/20/2013   KNEE PAIN 09/23/2008   PATELLAR TENDINITIS 09/23/2008   TEAR MEDIAL MENISCUS 09/25/2007   TEAR A C L 09/25/2007    Past Surgical History:  Procedure Laterality Date   ANTERIOR CRUCIATE LIGAMENT REPAIR     left, [facility]; [place]   BREAST BIOPSY  06/30/2011   Procedure: BREAST BIOPSY;  Surgeon: Donato Heinz;  Location: [facility];  Service: General;  Laterality: Left;   EXAMINATION UNDER ANESTHESIA Left 08/17/2013   Procedure: EXAM UNDER ANESTHESIA LEFT KNEE;  Surgeon: Carole Civil, MD;  Location: [facility];  Service: Orthopedics;  Laterality: Left;   KNEE ARTHROSCOPY WITH LATERAL MENISECTOMY Left 08/17/2013   Procedure: KNEE ARTHROSCOPY WITH PARTIAL LATERAL  MENISECTOMY, LIMITED DEBRIDEMENT;  Surgeon: Carole Civil, MD;  Location: [facility];  Service: Orthopedics;  Laterality: Left;   LAPAROSCOPIC GASTRIC RESTRICTIVE DUODENAL PROCEDURE (DUODENAL SWITCH)       OB History     Gravida      Para      Term      Preterm      AB      Living  0      SAB      IAB      Ectopic      Multiple      Live Births              Family History  Problem Relation Age of Onset   Migraines Mother    Anesthesia problems Neg Hx     Social History   Tobacco Use   Smoking status: Never   Smokeless tobacco: Never  Vaping Use   Vaping Use: Former  Substance Use Topics   Alcohol use: No   Drug use: No    Home Medications Prior to Admission medications   Medication Sig Start Date End Date Taking? Authorizing Provider  dicyclomine (BENTYL) 20 MG tablet Take 1 tablet (20 mg total) by mouth 3 (three) times daily as needed for spasms (abdominal cramping). 06/20/19   Long, Wonda Olds, MD  doxycycline (VIBRAMYCIN) 100 MG capsule Take 1 capsule (100 mg total) by mouth 2 (two) times daily. 05/05/21   Ezequiel Essex, MD  metroNIDAZOLE (FLAGYL) 500 MG tablet Take 1 tablet (500 mg total) by mouth 2 (two) times daily. 05/05/21   Rancour, Annie Main, MD  ondansetron (ZOFRAN ODT) 4 MG disintegrating tablet Take 1 tablet (4 mg total) by mouth every 8 (eight) hours as needed for nausea. 06/20/19   Long, Wonda Olds, MD    Allergies    Celery (apium graveolens Jackelyn Poling) skin test and Pineapple  Review of Systems   Review of Systems  All other systems reviewed and are negative.  Physical Exam Updated Vital Signs BP (!) 145/95   Pulse 65   Temp 98.2 F (36.8 C) (Oral)   Resp 20   Ht 5\' 4"  (1.626 m)   Wt 93 kg   LMP 08/23/2021   SpO2 100%   BMI 35.19 kg/m   Physical Exam Vitals and nursing note reviewed.  Constitutional:      General: She is not in acute distress.    Appearance: She is well-developed. She is not diaphoretic.  HENT:      Head: Normocephalic and atraumatic.     Mouth/Throat:     Mouth: Mucous membranes are moist.  Cardiovascular:     Rate and Rhythm: Normal rate and regular rhythm.     Heart sounds: No murmur heard.   No friction rub. No gallop.  Pulmonary:     Effort: Pulmonary effort is normal. No respiratory distress.     Breath sounds: Normal breath sounds. No wheezing.  Abdominal:     General: Bowel sounds are normal. There is no distension.     Palpations: Abdomen is soft.     Tenderness: There is no abdominal tenderness.  Musculoskeletal:        General: Normal range of motion.     Cervical back: Normal range of motion and neck supple.  Skin:    General: Skin is warm and dry.  Neurological:     General: No focal deficit present.     Mental Status: She is alert and oriented to person, place, and time.    ED Results / Procedures / Treatments   Labs (all labs ordered are listed, but only abnormal results are displayed) Labs Reviewed  CBC - Abnormal; Notable for the following components:      Result Value   Hemoglobin 11.9 (*)    Platelets 133 (*)    All other components within normal limits  COMPREHENSIVE METABOLIC PANEL - Abnormal; Notable for the following components:   Calcium 8.5 (*)    All other components within normal limits  RESP PANEL BY RT-PCR (FLU A&B, COVID) ARPGX2  LIPASE, BLOOD    EKG None  Radiology No results found.  Procedures Procedures   Medications Ordered in ED Medications  ketorolac (TORADOL) 30 MG/ML injection 30 mg (has no administration in time range)  ondansetron (ZOFRAN) injection 4 mg (has no administration in time range)  sodium chloride 0.9 % bolus 1,000 mL (has no administration in time range)    ED Course  I have reviewed the triage vital signs and the nursing notes.  Pertinent labs & imaging results that were available during my care of the patient were reviewed by me and considered in my medical decision making (see chart for  details).    MDM Rules/Calculators/A&P  Patient presenting with cough, congestion, and GI symptoms.  Patient's symptoms are most likely viral in nature.  Her vitals are stable and abdominal exam is benign.  Patient given IV fluids along with Toradol and Zofran.  She  is now asleep and resting comfortably.  I feel as though patient can safely be discharged with Zofran as needed for nausea and follow-up  Final Clinical Impression(s) / ED Diagnoses Final diagnoses:  None    Rx / DC Orders ED Discharge Orders     None        Veryl Speak, MD 08/25/21 [phone]

## 2022-08-11 ENCOUNTER — Encounter (HOSPITAL_COMMUNITY): Payer: Self-pay | Admitting: Emergency Medicine

## 2022-08-11 ENCOUNTER — Emergency Department (HOSPITAL_COMMUNITY)
Admission: EM | Admit: 2022-08-11 | Discharge: 2022-08-11 | Disposition: A | Discharge status: Home / Self Care | Payer: Commercial Managed Care - PPO | Attending: Emergency Medicine | Admitting: Emergency Medicine

## 2022-08-11 ENCOUNTER — Other Ambulatory Visit: Payer: Self-pay

## 2022-08-11 DIAGNOSIS — L02412 Cutaneous abscess of left axilla: Secondary | ICD-10-CM | POA: Insufficient documentation

## 2022-08-11 DIAGNOSIS — L02411 Cutaneous abscess of right axilla: Secondary | ICD-10-CM | POA: Insufficient documentation

## 2022-08-11 DIAGNOSIS — L0291 Cutaneous abscess, unspecified: Secondary | ICD-10-CM

## 2022-08-11 DIAGNOSIS — Z23 Encounter for immunization: Secondary | ICD-10-CM | POA: Insufficient documentation

## 2022-08-11 MED ORDER — OXYCODONE-ACETAMINOPHEN 5-325 MG PO TABS: 1.0000 | ORAL_TABLET | Freq: Four times a day (QID) | ORAL | 0 refills | Status: DC | PRN

## 2022-08-11 MED ORDER — LIDOCAINE-EPINEPHRINE (PF) 2 %-1:200000 IJ SOLN
10.0000 mL | Freq: Once | INTRAMUSCULAR | Status: AC
  Administered 2022-08-11: 10 mL
  Filled 2022-08-11: qty 20

## 2022-08-11 MED ORDER — DOXYCYCLINE HYCLATE 100 MG PO CAPS: 100.0000 mg | ORAL_CAPSULE | Freq: Two times a day (BID) | ORAL | 0 refills | Status: DC

## 2022-08-11 MED ORDER — TETANUS-DIPHTH-ACELL PERTUSSIS 5-2.5-18.5 LF-MCG/0.5 IM SUSY
0.5000 mL | PREFILLED_SYRINGE | Freq: Once | INTRAMUSCULAR | Status: AC
  Administered 2022-08-11: 0.5 mL via INTRAMUSCULAR
  Filled 2022-08-11: qty 0.5

## 2022-08-11 MED ORDER — DOXYCYCLINE HYCLATE 100 MG PO TABS: 100.0000 mg | ORAL_TABLET | Freq: Once | ORAL | Status: DC

## 2022-08-11 MED ORDER — LIDOCAINE-EPINEPHRINE-TETRACAINE (LET) TOPICAL GEL
3.0000 mL | Freq: Once | TOPICAL | Status: AC
  Administered 2022-08-11: 3 mL via TOPICAL
  Filled 2022-08-11: qty 3

## 2022-08-11 MED ORDER — DOXYCYCLINE HYCLATE 100 MG PO TABS
100.0000 mg | ORAL_TABLET | Freq: Once | ORAL | Status: AC
  Administered 2022-08-11: 100 mg via ORAL
  Filled 2022-08-11: qty 1

## 2022-08-11 NOTE — ED Notes (Signed)
Pt given prepack of oxycodone.

## 2022-08-11 NOTE — ED Provider Notes (Signed)
[facility] EMERGENCY DEPARTMENT Provider Note   CSN: [id number] Arrival date & time: 08/11/22  1539    History  Chief Complaint  Patient presents with   Abscess    Sarah Horn is a 31 y.o. female with past medical history here for evaluation of abscess.  She recently changed deodorants.  Has noted abscess in her bilateral axilla, right greater than left.  She denies any prior history of hidradenitis suppurativa however states this has been mentioned to her previously however no formal diagnosis.  She washes with Dove soap.  States most things "break me out."  No fever, nausea or vomiting.  Largest abscesses to right axilla where there is tenderness and redness.  HPI     Home Medications Prior to Admission medications   Medication Sig Start Date End Date Taking? Authorizing Provider  doxycycline (VIBRAMYCIN) 100 MG capsule Take 1 capsule (100 mg total) by mouth 2 (two) times daily. 08/11/22  Yes Kristyana Notte A, PA-C  oxyCODONE-acetaminophen (PERCOCET/ROXICET) 5-325 MG tablet Take 1 tablet by mouth every 6 (six) hours as needed for severe pain. 08/11/22  Yes Loni Delbridge A, PA-C  dicyclomine (BENTYL) 20 MG tablet Take 1 tablet (20 mg total) by mouth 3 (three) times daily as needed for spasms (abdominal cramping). 06/20/19   Long, Wonda Olds, MD  metroNIDAZOLE (FLAGYL) 500 MG tablet Take 1 tablet (500 mg total) by mouth 2 (two) times daily. 05/05/21   Rancour, Annie Main, MD  ondansetron (ZOFRAN ODT) 8 MG disintegrating tablet '8mg'$  ODT q4 hours prn nausea 08/25/21   Veryl Speak, MD      Allergies    Celery (apium graveolens var. dulce) skin test and Pineapple    Review of Systems   Review of Systems  Constitutional: Negative.   HENT: Negative.    Respiratory: Negative.    Cardiovascular: Negative.   Gastrointestinal: Negative.   Genitourinary: Negative.   Musculoskeletal: Negative.   Skin:  Positive for wound.  Neurological: Negative.   All other systems reviewed  and are negative.   Physical Exam Updated Vital Signs BP (!) 152/100 (BP Location: Right Arm)   Pulse 68   Temp 98.4 F (36.9 C) (Oral)   Resp 20   Ht '5\' 4"'$  (1.626 m)   Wt 89.1 kg   SpO2 100%   BMI 33.73 kg/m  Physical Exam Vitals and nursing note reviewed.  Constitutional:      General: She is not in acute distress.    Appearance: She is well-developed. She is not ill-appearing, toxic-appearing or diaphoretic.  HENT:     Head: Normocephalic and atraumatic.  Eyes:     Pupils: Pupils are equal, round, and reactive to light.  Cardiovascular:     Rate and Rhythm: Normal rate.     Pulses: Normal pulses.     Heart sounds: Normal heart sounds.  Pulmonary:     Effort: Pulmonary effort is normal. No respiratory distress.     Breath sounds: Normal breath sounds.  Abdominal:     General: Bowel sounds are normal. There is no distension.  Musculoskeletal:        General: Normal range of motion.     Cervical back: Normal range of motion.  Skin:    General: Skin is warm and dry.     Comments: #3  Small areas of induration, approximately 1 cm in diameter for each to left axilla, no obvious fluctuance, surrounding erythema or warmth.  Old areas of prior abscesses visible  Large 6 cm  area of induration and 3 cm area of fluctuance to right axilla appears to be loculated.  Old areas of prior abscesses visible suspicious for hidradenitis suppurativa  Neurological:     General: No focal deficit present.     Mental Status: She is alert.  Psychiatric:        Mood and Affect: Mood normal.     ED Results / Procedures / Treatments   Labs (all labs ordered are listed, but only abnormal results are displayed) Labs Reviewed - No data to display  EKG None  Radiology No results found.  Procedures .Marland KitchenIncision and Drainage  Date/Time: 08/11/2022 6:10 PM  Performed by: Nettie Elm, PA-C Authorized by: Nettie Elm, PA-C   Consent:    Consent obtained:  Verbal   Consent  given by:  Patient   Risks discussed:  Bleeding, incomplete drainage, pain and damage to other organs   Alternatives discussed:  No treatment Universal protocol:    Procedure explained and questions answered to patient or proxy's satisfaction: yes     Relevant documents present and verified: yes     Test results available : yes     Imaging studies available: yes     Required blood products, implants, devices, and special equipment available: yes     Site/side marked: yes     Immediately prior to procedure, a time out was called: yes     Patient identity confirmed:  Verbally with patient Location:    Type:  Abscess   Size:  6 cm   Location:  Upper extremity   Upper extremity location:  Arm   Arm location:  R upper arm Pre-procedure details:    Skin preparation:  Betadine Anesthesia:    Anesthesia method:  Local infiltration   Local anesthetic:  Lidocaine 1% WITH epi Procedure type:    Complexity:  Complex Procedure details:    Incision types:  Single straight   Incision depth:  Subcutaneous   Wound management:  Probed and deloculated, irrigated with saline and extensive cleaning   Drainage:  Purulent   Drainage amount:  Moderate   Wound treatment:  Drain placed   Packing materials:  1/4 in gauze Post-procedure details:    Procedure completion:  Tolerated with difficulty     Medications Ordered in ED Medications  lidocaine-EPINEPHrine (XYLOCAINE W/EPI) 2 %-1:200000 (PF) injection 10 mL (10 mLs Infiltration Given 08/11/22 1712)  Tdap (BOOSTRIX) injection 0.5 mL (0.5 mLs Intramuscular Given 08/11/22 1706)  lidocaine-EPINEPHrine-tetracaine (LET) topical gel (3 mLs Topical Given 08/11/22 1707)  doxycycline (VIBRA-TABS) tablet 100 mg (100 mg Oral Given 08/11/22 1821)    ED Course/ Medical Decision Making/ A&P    31 year old here for evaluation of abscess to bilateral axilla.  She has no systemic symptoms.  She is afebrile, nonseptic, not ill-appearing.  States she has had  these previously.  No formal diagnosis of hidradenitis suppurativa however on exam she has multiple old areas of abscesses which are suspicious for this.  She has #3, 1 cm region of induration to her left axilla without fluctuance.  These do not require drainage as there is no obvious drainage at this time.  This not consistent with lymphadenopathy.  She does have a large 6 cm area of induration with 3 cm of fluctuance to her right axilla which does require drainage.  Also had something previously in her inguinal crease in her labia.  I suspect based off her exam she likely has some degree of hidradenitis suppurativa.  I  discussed antibacterial soap.  We will start antibiotics and drainage of her right axillary abscess  See procedure note.  Patient did not tolerate well. Packing placed. Will need wound recheck in 2-3 days. Given multiple abscess suspect loculated given procedure. She might need to FU with Gen surgery. Discussed warm soaks and close FU. First abx given in ED  The patient has been appropriately medically screened and/or stabilized in the ED. I have low suspicion for any other emergent medical condition which would require further screening, evaluation or treatment in the ED or require inpatient management.  Patient is hemodynamically stable and in no acute distress.  Patient able to ambulate in department prior to ED.  Evaluation does not show acute pathology that would require ongoing or additional emergent interventions while in the emergency department or further inpatient treatment.  I have discussed the diagnosis with the patient and answered all questions.  Pain is been managed while in the emergency department and patient has no further complaints prior to discharge.  Patient is comfortable with plan discussed in room and is stable for discharge at this time.  I have discussed strict return precautions for returning to the emergency department.  Patient was encouraged to follow-up with  PCP/specialist refer to at discharge.                            Medical Decision Making Amount and/or Complexity of Data Reviewed External Data Reviewed: labs, radiology and notes.  Risk OTC drugs. Prescription drug management. Parenteral controlled substances. Decision regarding hospitalization. Diagnosis or treatment significantly limited by social determinants of health.           Final Clinical Impression(s) / ED Diagnoses Final diagnoses:  Abscess    Rx / DC Orders ED Discharge Orders          Ordered    oxyCODONE-acetaminophen (PERCOCET/ROXICET) 5-325 MG tablet  Every 6 hours PRN        08/11/22 1809    doxycycline (VIBRAMYCIN) 100 MG capsule  2 times daily        08/11/22 1813              Josaphine Shimamoto A, PA-C 08/11/22 2259    Godfrey Pick, MD 08/12/22 [phone]

## 2022-08-11 NOTE — ED Triage Notes (Signed)
Pt states bilateral abscess' to underarms x 3 days. Denies drainage at this time.

## 2022-08-11 NOTE — Discharge Instructions (Signed)
You will need return in 3 days for wound recheck.  Keep the packing in place.  Let warm soapy water run over this area.  Warm compress.  Take the antibiotics as prescribed.

## 2022-08-12 ENCOUNTER — Encounter (HOSPITAL_COMMUNITY): Payer: Self-pay

## 2022-08-12 ENCOUNTER — Emergency Department (HOSPITAL_COMMUNITY)
Admission: EM | Admit: 2022-08-12 | Discharge: 2022-08-12 | Disposition: A | Discharge status: Home / Self Care | Payer: Self-pay | Attending: Emergency Medicine | Admitting: Emergency Medicine

## 2022-08-12 ENCOUNTER — Other Ambulatory Visit: Payer: Self-pay

## 2022-08-12 DIAGNOSIS — L02411 Cutaneous abscess of right axilla: Secondary | ICD-10-CM | POA: Insufficient documentation

## 2022-08-12 DIAGNOSIS — L02419 Cutaneous abscess of limb, unspecified: Secondary | ICD-10-CM

## 2022-08-12 MED ORDER — DOXYCYCLINE HYCLATE 100 MG PO CAPS: 100.0000 mg | ORAL_CAPSULE | Freq: Two times a day (BID) | ORAL | 0 refills | Status: DC

## 2022-08-12 MED ORDER — KETOROLAC TROMETHAMINE 15 MG/ML IJ SOLN
15.0000 mg | Freq: Once | INTRAMUSCULAR | Status: AC
  Administered 2022-08-12: 15 mg via INTRAMUSCULAR
  Filled 2022-08-12: qty 1

## 2022-08-12 MED ORDER — OXYCODONE-ACETAMINOPHEN 5-325 MG PO TABS: 1.0000 | ORAL_TABLET | Freq: Three times a day (TID) | ORAL | 0 refills | Status: DC | PRN

## 2022-08-12 MED FILL — Oxycodone w/ Acetaminophen Tab 5-325 MG: ORAL | Qty: 6 | Status: AC

## 2022-08-12 NOTE — Discharge Instructions (Addendum)
Follow-up with the surgeon as planned.

## 2022-08-12 NOTE — ED Provider Notes (Signed)
[facility] EMERGENCY DEPARTMENT Provider Note   CSN: [id number] Arrival date & time: 08/12/22  1624     History  No chief complaint on file.   Sarah Horn is a 31 y.o. female.  HPI Patient had right axillary abscess drained yesterday.  Continued pain.  States unable to sleep last night.  Was not given pain medicines.  No fevers or chills.  No drainage.  States she also has abscess in the left axilla that was not drained.  No fevers or chills.   Past Medical History:  Diagnosis Date   Irregular menstrual cycle    Sleep apnea    resolved with weigh loss surgery    Home Medications Prior to Admission medications   Medication Sig Start Date End Date Taking? Authorizing Provider  dicyclomine (BENTYL) 20 MG tablet Take 1 tablet (20 mg total) by mouth 3 (three) times daily as needed for spasms (abdominal cramping). 06/20/19   Long, Wonda Olds, MD  doxycycline (VIBRAMYCIN) 100 MG capsule Take 1 capsule (100 mg total) by mouth 2 (two) times daily. 08/12/22   Davonna Belling, MD  metroNIDAZOLE (FLAGYL) 500 MG tablet Take 1 tablet (500 mg total) by mouth 2 (two) times daily. 05/05/21   Rancour, Annie Main, MD  ondansetron (ZOFRAN ODT) 8 MG disintegrating tablet '8mg'$  ODT q4 hours prn nausea 08/25/21   Veryl Speak, MD  oxyCODONE-acetaminophen (PERCOCET/ROXICET) 5-325 MG tablet Take 1-2 tablets by mouth every 8 (eight) hours as needed for severe pain. 08/12/22   Davonna Belling, MD      Allergies    Celery (apium graveolens Jackelyn Poling) skin test and Pineapple    Review of Systems   Review of Systems  Physical Exam Updated Vital Signs BP 132/84 (BP Location: Left Arm)   Pulse 78   Temp 98.8 F (37.1 C) (Oral)   Resp 18   Ht '5\' 4"'$  (1.626 m)   Wt 88.9 kg   SpO2 100%   BMI 33.64 kg/m  Physical Exam Vitals and nursing note reviewed.  Cardiovascular:     Rate and Rhythm: Regular rhythm.  Skin:    Comments: Right axillary tenderness.  Packing in place.  No active drainage.   Smaller than reported abscess prior.  Neurological:     Mental Status: She is alert.     ED Results / Procedures / Treatments   Labs (all labs ordered are listed, but only abnormal results are displayed) Labs Reviewed - No data to display  EKG None  Radiology No results found.  Procedures Procedures    Medications Ordered in ED Medications  ketorolac (TORADOL) 15 MG/ML injection 15 mg (has no administration in time range)    ED Course/ Medical Decision Making/ A&P                           Medical Decision Making Risk Prescription drug management.   Patient with right axillary abscess.  Drained yesterday.  Worsening pain.  Not given pain medicines.  This not appear to be worsening infection.  Has been given antibiotics.  Will give Toradol shot here to help with pain and will give narcotics for home.  Outpatient follow-up as planned prior.  After further discussion patient had not gotten the antibiotics filled and did not get the paper prescription for the pain medicine.  Will send these to [facility] at patient's request.        Final Clinical Impression(s) / ED Diagnoses Final diagnoses:  Axillary  abscess    Rx / DC Orders ED Discharge Orders          Ordered    oxyCODONE-acetaminophen (PERCOCET/ROXICET) 5-325 MG tablet  Every 8 hours PRN        08/12/22 1725    doxycycline (VIBRAMYCIN) 100 MG capsule  2 times daily        08/12/22 1725              Davonna Belling, MD 08/12/22 1725

## 2022-08-12 NOTE — ED Triage Notes (Signed)
Pt reports being seen yesterday for abscess. Had I&D. Pt reports not being able to get in to see PCP. Pt reports pain and wanting something to help.

## 2023-11-07 ENCOUNTER — Emergency Department (HOSPITAL_COMMUNITY)

## 2023-11-07 ENCOUNTER — Emergency Department (HOSPITAL_COMMUNITY)
Admission: EM | Admit: 2023-11-07 | Discharge: 2023-11-07 | Disposition: A | Discharge status: Home / Self Care | Attending: Emergency Medicine | Admitting: Emergency Medicine

## 2023-11-07 ENCOUNTER — Other Ambulatory Visit: Payer: Self-pay

## 2023-11-07 ENCOUNTER — Encounter (HOSPITAL_COMMUNITY): Payer: Self-pay | Admitting: *Deleted

## 2023-11-07 DIAGNOSIS — S5002XA Contusion of left elbow, initial encounter: Secondary | ICD-10-CM | POA: Diagnosis not present

## 2023-11-07 DIAGNOSIS — M25562 Pain in left knee: Secondary | ICD-10-CM | POA: Diagnosis present

## 2023-11-07 DIAGNOSIS — S8002XA Contusion of left knee, initial encounter: Secondary | ICD-10-CM | POA: Diagnosis not present

## 2023-11-07 DIAGNOSIS — W010XXA Fall on same level from slipping, tripping and stumbling without subsequent striking against object, initial encounter: Secondary | ICD-10-CM | POA: Diagnosis not present

## 2023-11-07 MED ORDER — NAPROXEN 250 MG PO TABS
500.0000 mg | ORAL_TABLET | Freq: Once | ORAL | Status: AC
  Administered 2023-11-07: 500 mg via ORAL
  Filled 2023-11-07: qty 2

## 2023-11-07 MED ORDER — MELOXICAM 15 MG PO TABS
15.0000 mg | ORAL_TABLET | Freq: Every day | ORAL | 0 refills | Status: AC
Start: 2023-11-07 — End: 2023-11-21

## 2023-11-07 NOTE — Discharge Instructions (Addendum)
 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  You may have a recurrent injury of the ligaments in your knee such as the ACL or MCL so I would strongly recommend that you see Dr. Margrette this week.  I would recommend that you wear the knee immobilizer and use the crutches until you follow-up.  I would also recommend that you take an anti-inflammatory.  For this reason I have prescribed 1  Please take Mobic ,  once daily as needed for pain - this in an antiinflammatory medicine (NSAID) and is similar to ibuprofen  - many people feel that it is stronger than ibuprofen  and it is easier to take since it is a smaller pill.  Please use this only for 1 week - if your pain persists, you will need to follow up with your doctor in the office for ongoing guidance and pain control.  RICE therapy:  Apply ice wrapped in a towel intermittently keeping it on the skin no longer than 10 minutes a couple of times an hour  Elevate the affected extremity to help reduce blood flow and prevent swelling  Use an anti-inflammatory if you are not allergic to it such as ibuprofen  or Naprosyn  to help with pain and swelling  Use a compressive device whether it is an Ace wrap or other  immobilizer to help minimize movement and compress the swelling.

## 2023-11-07 NOTE — ED Triage Notes (Signed)
 Pt was at work and patient was going down a ramp that was icy when she stepped out of door, she just slipped and fell on left knee and hit elbow on hand rail. Pt states knee is numb in middle and her toes feel numb. Pt has palpable DPP to left foot

## 2023-11-07 NOTE — ED Provider Notes (Signed)
  EMERGENCY DEPARTMENT AT [facility] Provider Note   CSN: [id number] Arrival date & time: 11/07/23  1229     History  Chief Complaint  Patient presents with   Knee Pain    Sarah Horn is a 33 y.o. female.   Knee Pain    This patient is a 33 year old female, she presents to the hospital today with a complaint of left-sided knee pain and left elbow pain that occurred when she fell this morning on a wet wooden ramp, she states that her feet slipped and she fell landing directly on her left patella and her left elbow on a railing.  She has been able to ambulate since that time but has had difficulty with it because of pain.  She has a history of a prior ACL and MCL repair on that same side on her knee.  She denies head injury  Home Medications Prior to Admission medications   Medication Sig Start Date End Date Taking? Authorizing Provider  meloxicam  (MOBIC ) 15 MG tablet Take 1 tablet (15 mg total) by mouth daily for 14 days. 11/07/23 11/21/23 Yes Cleotilde Rogue, MD  dicyclomine  (BENTYL ) 20 MG tablet Take 1 tablet (20 mg total) by mouth 3 (three) times daily as needed for spasms (abdominal cramping). 06/20/19   Long, Fonda MATSU, MD  doxycycline  (VIBRAMYCIN ) 100 MG capsule Take 1 capsule (100 mg total) by mouth 2 (two) times daily. 08/12/22   Patsey Lot, MD  metroNIDAZOLE  (FLAGYL ) 500 MG tablet Take 1 tablet (500 mg total) by mouth 2 (two) times daily. 05/05/21   Rancour, Garnette, MD  ondansetron  (ZOFRAN  ODT) 8 MG disintegrating tablet 8mg  ODT q4 hours prn nausea 08/25/21   Geroldine Berg, MD  oxyCODONE -acetaminophen  (PERCOCET/ROXICET) 5-325 MG tablet Take 1-2 tablets by mouth every 8 (eight) hours as needed for severe pain. 08/12/22   Patsey Lot, MD      Allergies    Celery (apium graveolens dollie arms) skin test and Pineapple    Review of Systems   Review of Systems  All other systems reviewed and are negative.   Physical Exam Updated Vital  Signs BP (!) 133/90 (BP Location: Right Arm)   Pulse 76   Temp 97.6 F (36.4 C) (Oral)   Resp 16   LMP 10/26/2023   SpO2 100%  Physical Exam Vitals and nursing note reviewed.  Constitutional:      Appearance: She is well-developed. She is not diaphoretic.  HENT:     Head: Normocephalic and atraumatic.  Eyes:     General:        Right eye: No discharge.        Left eye: No discharge.     Conjunctiva/sclera: Conjunctivae normal.  Pulmonary:     Effort: Pulmonary effort is normal. No respiratory distress.  Musculoskeletal:     Comments: Both the elbow and the knee appeared to be atraumatic, supple joints, good range of motion, elbow can be taken through full range of motion including pronation and supination flexion and extension, no tenderness over the bony structures of the elbow.  Skin:    General: Skin is warm and dry.     Findings: No erythema or rash.  Neurological:     Mental Status: She is alert.     Coordination: Coordination normal.     Comments: The patient is able to ambulate and has normal strength at the knee extensors, knee flexors as well as ankle dorsiflexion and plantarflexion.  She has normal sensation  of the lower extremity diffusely, she does have some tenderness over the patella but is able to straight leg raise against resistance     ED Results / Procedures / Treatments   Labs (all labs ordered are listed, but only abnormal results are displayed) Labs Reviewed - No data to display  EKG None  Radiology DG Elbow 2 Views Left Result Date: 11/07/2023 CLINICAL DATA:  Pain after fall. EXAM: LEFT ELBOW - 2 VIEW COMPARISON:  None Available. FINDINGS: Normal position of the distal anterior humeral fat pad without evidence of a joint effusion. Minimal degenerative enthesopathic change at the triceps insertion on the olecranon. Although no acute fracture line is seen, on frontal view there is question of mild asymmetry of the radial head-neck junction with the  radial neck mildly medial to the radial head on frontal view. This may be chronic. IMPRESSION: 1. No acute fracture line is seen. No joint effusion is seen to suggest secondary signs of an acute fracture. 2. Question of mild asymmetry of the radial head-neck junction with the radial neck mildly deviated medially. This may be chronic. Recommend correlation with point tenderness. Electronically Signed   By: Tanda Lyons M.D.   On: 11/07/2023 15:45   DG Knee 2 Views Left Result Date: 11/07/2023 CLINICAL DATA:  Knee pain status post fall. EXAM: LEFT KNEE - 1-2 VIEW COMPARISON:  Left knee radiographs 07/11/2013 FINDINGS: There is diffuse decreased bone mineralization. Severe medial compartment joint space narrowing bone-on-bone contact, moderate medial tibial plateau subchondral cystic change, and large peripheral osteophytes, progressed from prior 07/11/2013 remote comparison radiograph. Moderate peripheral lateral compartment degenerative osteophytosis without joint space narrowing. Mild lateral compartment chondrocalcinosis. Moderate to severe patellofemoral joint space narrowing and peripheral osteophytosis, progressed from prior. Tiny joint effusion. No acute fracture or dislocation. IMPRESSION: 1. 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 2. No acute fracture. Electronically Signed   By: Tanda Lyons M.D.   On: 11/07/2023 15:41    Procedures Procedures    Medications Ordered in ED Medications  naproxen  (NAPROSYN ) tablet 500 mg (has no administration in time range)    ED Course/ Medical Decision Making/ A&P                                 Medical Decision Making Risk Prescription drug management.   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  She has seen Dr. Margrette in the past for these prior surgeries and is comfortable following up with orthopedics.  Will recommend an NSAID for home as well, the patient is agreeable        Final Clinical Impression(s) / ED Diagnoses Final diagnoses:  Contusion of left knee, initial encounter  Contusion of left elbow, initial encounter    Rx / DC Orders ED Discharge Orders          Ordered    meloxicam  (MOBIC ) 15 MG tablet  Daily  11/07/23 1555              Cleotilde Rogue, MD 11/07/23 1556

## 2023-11-07 NOTE — ED Notes (Signed)
 Ortho paged and on their way down.

## 2023-11-07 NOTE — ED Provider Triage Note (Signed)
 Emergency Medicine Provider Triage Evaluation Note  Sarah Horn , a 33 y.o. female  was evaluated in triage.  Pt complains of pain in L knee after fall down wooden ramp at work 4 hours ago on the ice. Now Endorses numbness and tingling L toes since falling. Has been able to ambulate normally since fall. Previous ACL and MCL surgery in 2010 / 2017.   Review of Systems  Positive: Cold feet Negative: LOC, head injury  Physical Exam  BP (!) 133/90 (BP Location: Right Arm)   Pulse 76   Temp 97.6 F (36.4 C) (Oral)   Resp 16   SpO2 100%  Gen:   Awake, no distress   Resp:  Normal effort  MSK:   Moves extremities without difficulty  Other:    Medical Decision Making  Medically screening exam initiated at 2:06 PM.  Appropriate orders placed.  Sarah Horn was informed that the remainder of the evaluation will be completed by another provider, this initial triage assessment does not replace that evaluation, and the importance of remaining in the ED until their evaluation is complete.     [name], [place] 11/07/23 1413

## 2023-11-07 NOTE — Progress Notes (Signed)
 Orthopedic Tech Progress Note Patient Details:  Sarah Horn 07/19/1991 [id number]  Ortho Devices Type of Ortho Device: Crutches, Knee Immobilizer Ortho Device/Splint Location: LLE Ortho Device/Splint Interventions: Ordered, Adjustment, Application   Post Interventions Patient Tolerated: Well, Ambulated well Instructions Provided: Care of device  Delanna LITTIE Pac 11/07/2023, 4:30 PM

## 2023-11-09 ENCOUNTER — Telehealth: Payer: Self-pay | Admitting: Radiology

## 2023-11-09 NOTE — Telephone Encounter (Signed)
 That is all we have, if anything opens sooner, I will let her know.

## 2023-11-09 NOTE — Telephone Encounter (Signed)
-----   Message from Tyrone Z sent at 11/09/2023 12:11 PM EST ----- Regarding: Secure  Upcoming appt., with Dr. Margrette, MD    Regarding [facility] patient DOI: 11-07-23 re Elbow Lt & Knee Lt Jariah Tarkowski  Ms. Chalker is wanting to be seen by Dr. Margrette, MD. I scheduled appt., for next week for she don't have her WC info., yet but is going to visit the work office HR Dept to hopefully get today. If so can you guys see here earlier then the appt., I scheduled for next week (Monday 11/14/23 10:15a). Her elbow and knee are in pain and she has some numbing in the left arm. Thx Tisha

## 2023-11-10 NOTE — Progress Notes (Signed)
  Intake history:  BP 139/89   Pulse 80   Ht 5' 5 (1.651 m)   Wt 190 lb (86.2 kg)   LMP 10/26/2023   BMI 31.62 kg/m  Body mass index is 31.62 kg/m.    WHAT ARE WE SEEING YOU FOR TODAY?   left elbow and left knee WC   How long has this bothered you? 11/07/23  on 11/07/23  Anticoag.  No  Diabetes No  Heart disease No  Hypertension No  SMOKING HX No  Kidney disease No  Any ALLERGIES ______________________________________________   Treatment:  Have you taken:  Tylenol  Yes  Advil  No  Had PT No  Had injection No  Other  ___________in immobilizer  has Rx for Meloxicam  not taking did not get yet ______________

## 2023-11-14 ENCOUNTER — Ambulatory Visit: Admitting: Orthopedic Surgery

## 2023-11-14 ENCOUNTER — Encounter: Payer: Self-pay | Admitting: Orthopedic Surgery

## 2023-11-14 VITALS — BP 139/89 | HR 80 | Ht 65.0 in | Wt 190.0 lb

## 2023-11-14 DIAGNOSIS — S83502A Sprain of unspecified cruciate ligament of left knee, initial encounter: Secondary | ICD-10-CM

## 2023-11-14 DIAGNOSIS — S5002XA Contusion of left elbow, initial encounter: Secondary | ICD-10-CM | POA: Diagnosis not present

## 2023-11-14 NOTE — Progress Notes (Signed)
   Patient: Sarah Horn           Date of Birth: 1990-11-15           MRN: [id number] Visit Date: 11/14/2023 Requested by: Shona Norleen PEDLAR, MD [address] [place],  [place] [address] PCP: Shona Norleen PEDLAR, MD   33 year old female injured at work on 6 January slipping on some ice landing on her left side.  She went to the emergency room complaint of left elbow pain with some numbness may have hit her funny bone that has resolved she also has some left knee pain  Medial and lateral knee pain primarily around the tibia  She had prior surgery on the left knee back in 2014  08/17/2013   PRE-OPERATIVE DIAGNOSIS:  left medial meniscal and lateral tear   POST-OPERATIVE DIAGNOSIS:  left  lateral meniscus tear, anterior cruciate ligament graft rupture   PROCEDURE:  Arthroscopy left knee, partial lateral menisectomy and limited debridement , Chondroplasties  SURGEON:  Surgeon(s) and Role:    * Taft FORBES Minerva, MD - Primary  Review of systems numbness in the left upper extremity resolved  Physical Exam Vitals reviewed: WELLDEVELOPED WELL NOURISHED NO CONGENITAL ABNORMALITIES.  Cardiovascular:     Pulses: Normal pulses.     Comments: NO SWELLING OR VARICOSITIES  Musculoskeletal:     Right knee: Normal.     Left knee: Normal.  Skin:    General: Skin is warm and dry.     Capillary Refill: Capillary refill takes less than 2 seconds.     Findings: No bruising, erythema or rash.  Neurological:     General: No focal deficit present.     Mental Status: She is oriented to person, place, and time.     Gait: Gait abnormal.     Comments: NORMAL SENSATION IN BOTH LOWER EXTREMITIES   Psychiatric:        Mood and Affect: Mood normal.        Behavior: Behavior normal.        Thought Content: Thought content normal.        Judgment: Judgment normal.    Left elbow full range of motion no tenderness no swelling normal neurovascular exam left upper extremity  Left knee no effusion midline  incision from prior surgery Medial lateral tenderness proximal tibia Knee flexion 0 to 90 degrees Obvious graft laxity from prior graft rupture  Encounter Diagnoses  Name Primary?   Contusion of left elbow, initial encounter Yes   Sprain of cruciate ligament of left knee, initial encounter     Recommend brace, hinged knee brace left knee range of motion as tolerated weightbearing as tolerated with brace and crutch as needed  Meloxicam   Return 2 weeks reexamine knee determine if further imaging needed  Out of work until 23 January

## 2023-11-14 NOTE — Patient Instructions (Signed)
 OOW JAN6 TO JAN 23

## 2023-11-23 DIAGNOSIS — S5002XA Contusion of left elbow, initial encounter: Secondary | ICD-10-CM | POA: Insufficient documentation

## 2023-11-23 NOTE — Progress Notes (Unsigned)
   LMP 10/26/2023   There is no height or weight on file to calculate BMI.  No chief complaint on file.   Encounter Diagnoses  Name Primary?   Contusion of left elbow, subsequent encounter WC1/6/25 Yes   Sprain of cruciate ligament of left knee, subsequent encounter WC 11/07/23     DOI/DOS/ Date: 11/07/23 WC injury   {CHL AMB ORT SYMPTOMS POST TREATMENT:21798}

## 2023-11-24 ENCOUNTER — Ambulatory Visit (INDEPENDENT_AMBULATORY_CARE_PROVIDER_SITE_OTHER): Admitting: Orthopedic Surgery

## 2023-11-24 ENCOUNTER — Encounter: Payer: Self-pay | Admitting: Orthopedic Surgery

## 2023-11-24 VITALS — BP 134/84 | HR 83 | Ht 65.0 in | Wt 190.0 lb

## 2023-11-24 DIAGNOSIS — S5002XD Contusion of left elbow, subsequent encounter: Secondary | ICD-10-CM | POA: Diagnosis not present

## 2023-11-24 DIAGNOSIS — S83502D Sprain of unspecified cruciate ligament of left knee, subsequent encounter: Secondary | ICD-10-CM | POA: Diagnosis not present

## 2023-11-24 NOTE — Progress Notes (Signed)
Patient ID: Sarah Horn, female   DOB: 01/28/1991, 32 y.o.   MRN: [id number]  BP 134/84   Pulse 83   Ht 5\' 5"  (1.651 m)   Wt 190 lb (86.2 kg)   LMP 10/26/2023   BMI 31.62 kg/m   Chief Complaint  Patient presents with   Knee Pain    Left pain continues    Encounter Diagnoses  Name Primary?   Contusion of left elbow, subsequent encounter WC1/6/25 Yes   Sprain of cruciate ligament of left knee, subsequent encounter WC 11/07/23     HPI  33 year old female injured at work on 6 January slipping on some ice landing on her left side. She went to the emergency room complaint of left elbow pain with some numbness may have hit her funny bone that has resolved she also has some left knee pain   Treatment   Recommend brace, hinged knee brace. left knee range of motion as tolerated. weightbearing as tolerated with brace and crutch as needed.   Meloxicam   Return 2 weeks reexamine knee determine if further imaging needed   Out of work until 23 January  Today:   Encounter Diagnoses  Name Primary?   Contusion of left elbow, subsequent encounter WC1/6/25 Yes   Sprain of cruciate ligament of left knee, subsequent encounter WC 11/07/23        DOI/DOS/ Date: 11/07/23 WC injury    Unchanged  Reevaluation left knee tenderness medial lateral joint line effusion range of motion limited 0-90 positive anterior drawer test may be acute versus chronic  Recommend     MRI LEFT KNEE   OOW # 3 WEEKS  return here for results and further management

## 2023-11-24 NOTE — Patient Instructions (Signed)
Work note out of work until after MRI

## 2023-12-05 ENCOUNTER — Ambulatory Visit (HOSPITAL_COMMUNITY)

## 2023-12-05 ENCOUNTER — Ambulatory Visit (HOSPITAL_COMMUNITY): Admission: RE | Admit: 2023-12-05 | Source: Ambulatory Visit

## 2023-12-05 ENCOUNTER — Telehealth: Payer: Self-pay | Admitting: Orthopedic Surgery

## 2023-12-05 NOTE — Telephone Encounter (Signed)
 Mychart message sent.

## 2023-12-19 ENCOUNTER — Telehealth: Payer: Self-pay | Admitting: Orthopedic Surgery

## 2023-12-19 ENCOUNTER — Encounter: Payer: Self-pay | Admitting: Orthopedic Surgery

## 2023-12-19 ENCOUNTER — Ambulatory Visit (INDEPENDENT_AMBULATORY_CARE_PROVIDER_SITE_OTHER): Admitting: Orthopedic Surgery

## 2023-12-19 DIAGNOSIS — M1712 Unilateral primary osteoarthritis, left knee: Secondary | ICD-10-CM | POA: Diagnosis not present

## 2023-12-19 NOTE — Progress Notes (Signed)
 Subjective:     Patient ID: Sarah Horn, female   DOB: [birth year], 33 y.o.   MRN: [id number]  Today December 19, 2023:   This is a follow-up visit.  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  She went to the emergency room complaint of left elbow pain with some numbness may have hit her funny bone that has resolved she also has some left knee pain   Medial and lateral knee pain primarily around the tibia      Review of Systems     Objective:   Physical Exam As noted previously the patient has limited range of motion of the left knee positive laxity and a anterior drawer testing, effusion with medial joint line tenderness and weak quadriceps extension    Assessment:     See MRI below  In my opinion this is a 33 year old female has end-stage arthritis of her left knee.  Patient reports again that she was asymptomatic prior to this injury.  Patient is now symptomatic with grade 4 arthritis of the medial compartment of the left knee and is not a candidate for any surgical intervention except total knee arthroplasty  Patient will be sent for physical therapy and second opinion regarding knee replacement.  Independent evaluation of the MRI report and independent reading.  The patient has evidence of old meniscal tears with full-thickness cartilage defects in the medial femoral condyle and patellofemoral joint and partial cartilage defects on the lateral side  MRI report CLINICAL DATA: Left anterior knee pain and swelling.  EXAM: MRI OF THE  LEFT KNEE WITHOUT CONTRAST  TECHNIQUE: Multiplanar, multisequence MR imaging of the knee was performed. No intravenous contrast was administered.  COMPARISON: 07/23/2013  FINDINGS: MENISCI  Medial: Attenuation of the posterior horn of the medial meniscus which may reflect prior meniscectomy versus a radial tear with peripheral meniscal extrusion.  Lateral: Radial tear versus partial meniscectomy of the posterior horn of the lateral meniscus towards the root.  LIGAMENTS  Cruciates: Prior ACL repair with a complete tear of the ACL graft. Intact PCL  Collaterals: Medial collateral ligament is intact. Lateral collateral ligament complex is intact.  CARTILAGE  Patellofemoral: Full-thickness cartilage loss of the medial trochlea. Mild partial-thickness cartilage loss of the medial patellar facet.  Medial: Full-thickness cartilage loss of the weight-bearing medial femorotibial compartment with marginal osteophytes.  Lateral: Mild partial-thickness cartilage loss of the weight-bearing lateral femorotibial compartment.  JOINT: No joint effusion. Normal Hoffa's fat-pad. No plical thickening.  POPLITEAL FOSSA: Popliteus tendon is intact. No Baker's cyst. 1.4 x 2.4 cm ganglion cyst at the origin of the lateral gastrocnemius tendon.  EXTENSOR MECHANISM: Intact quadriceps tendon. Intact patellar tendon. Intact lateral patellar retinaculum. Intact medial patellar retinaculum. Intact MPFL.  BONES: No aggressive osseous lesion. No fracture or dislocation.  Other: No fluid collection or hematoma. Muscles are normal.  IMPRESSION: 1. Prior ACL repair with a complete tear of the ACL graft. 2. Attenuation of the posterior horn of the medial meniscus which may reflect prior  meniscectomy versus a radial tear with peripheral meniscal extrusion. 3. Radial tear versus partial meniscectomy of the posterior horn of the lateral meniscus towards the root. 4. Tricompartmental cartilage  abnormalities as described above. Severe full-thickness medial femorotibial compartment cartilage loss consistent with severe osteoarthritis.   Electronically Signed By: Elige Ko M.D. On: 12/13/2023 14:5    Plan:     Recommend second opinion  Recommend physical therapy  Recommend Tylenol Q6 500 mg  Recommend ibuprofen 600 Q8  Given second opinion regarding knee replacement a 33 year old female

## 2023-12-19 NOTE — Telephone Encounter (Signed)
 Needs physical therapy and a second opinion / take over care for surgical consult left knee/ is WC will you get approval?

## 2023-12-19 NOTE — Progress Notes (Signed)
   There were no vitals taken for this visit.  There is no height or weight on file to calculate BMI.  Chief Complaint  Patient presents with   Knee Pain    Left wc injury 11/07/23    No diagnosis found.  DOI/DOS/ Date: 11/07/23  Unchanged      CLINICAL DATA: Left anterior knee pain and swelling.  EXAM: MRI OF THE LEFT KNEE WITHOUT CONTRAST  TECHNIQUE: Multiplanar, multisequence MR imaging of the knee was performed. No intravenous contrast was administered.  COMPARISON: 07/23/2013  FINDINGS: MENISCI  Medial: Attenuation of the posterior horn of the medial meniscus which may reflect prior meniscectomy versus a radial tear with peripheral meniscal extrusion.  Lateral: Radial tear versus partial meniscectomy of the posterior horn of the lateral meniscus towards the root.  LIGAMENTS  Cruciates: Prior ACL repair with a complete tear of the ACL graft. Intact PCL  Collaterals: Medial collateral ligament is intact. Lateral collateral ligament complex is intact.  CARTILAGE  Patellofemoral: Full-thickness cartilage loss of the medial trochlea. Mild partial-thickness cartilage loss of the medial patellar facet.  Medial: Full-thickness cartilage loss of the weight-bearing medial femorotibial compartment with marginal osteophytes.  Lateral: Mild partial-thickness cartilage loss of the weight-bearing lateral femorotibial compartment.  JOINT: No joint effusion. Normal Hoffa's fat-pad. No plical thickening.  POPLITEAL FOSSA: Popliteus tendon is intact. No Baker's cyst. 1.4 x 2.4 cm ganglion cyst at the origin of the lateral gastrocnemius tendon.  EXTENSOR MECHANISM: Intact quadriceps tendon. Intact patellar tendon. Intact lateral patellar retinaculum. Intact medial patellar retinaculum. Intact MPFL.  BONES: No aggressive osseous lesion. No fracture or dislocation.  Other: No fluid collection or hematoma. Muscles are normal.  IMPRESSION: 1. Prior ACL repair  with a complete tear of the ACL graft. 2. Attenuation of the posterior horn of the medial meniscus which may reflect prior meniscectomy versus a radial tear with peripheral meniscal extrusion. 3. Radial tear versus partial meniscectomy of the posterior horn of the lateral meniscus towards the root. 4. Tricompartmental cartilage abnormalities as described above. Severe full-thickness medial femorotibial compartment cartilage loss consistent with severe osteoarthritis.   Electronically Signed By: Elige Ko M.D. On: 12/13/2023 14:59

## 2023-12-19 NOTE — Patient Instructions (Addendum)
 Sarah Horn with WC will get approval for the second opinion and the physical therapy    Tylenol XS 500 mg every 6 hrs    Ibuprofen 600 mg every 8 hrs

## 2023-12-23 ENCOUNTER — Telehealth: Payer: Self-pay | Admitting: Radiology

## 2023-12-23 DIAGNOSIS — M1712 Unilateral primary osteoarthritis, left knee: Secondary | ICD-10-CM

## 2023-12-23 NOTE — Telephone Encounter (Signed)
-----   Message from Renville Z sent at 12/23/2023 10:38 AM EST ----- Regarding: Secure   Therapy per [facility] is pre-certified Email below from [facility] advising on the therapy referral.  Thurs 12-22-23  From Donnetta Hutching [email or web address]  To Chipper Oman  18 sessions of therapy pre-certified  The adjuster has agreed to 2nd opinion with joint provider, Please advise once schedule.  Thank you  Donnetta Hutching  Medical Professional  Workers [employer] [facility] W: [phone]   F: [phone]  Mailing Address: [employer] - [address]  [place], [place] [address]

## 2023-12-23 NOTE — Telephone Encounter (Signed)
 WC has approved second opinion for her knee   Who do you want to refer her to?

## 2024-09-13 ENCOUNTER — Emergency Department (HOSPITAL_COMMUNITY)
Admission: EM | Admit: 2024-09-13 | Discharge: 2024-09-13 | Disposition: A | Discharge status: Home / Self Care | Payer: Self-pay | Attending: Emergency Medicine | Admitting: Emergency Medicine

## 2024-09-13 ENCOUNTER — Other Ambulatory Visit: Payer: Self-pay

## 2024-09-13 ENCOUNTER — Encounter (HOSPITAL_COMMUNITY): Payer: Self-pay

## 2024-09-13 DIAGNOSIS — N898 Other specified noninflammatory disorders of vagina: Secondary | ICD-10-CM

## 2024-09-13 DIAGNOSIS — L309 Dermatitis, unspecified: Secondary | ICD-10-CM | POA: Insufficient documentation

## 2024-09-13 LAB — WET PREP, GENITAL
Clue Cells Wet Prep HPF POC: NONE SEEN
Sperm: NONE SEEN
Trich, Wet Prep: NONE SEEN
WBC, Wet Prep HPF POC: <10 (ref <10)
Yeast Wet Prep HPF POC: NONE SEEN

## 2024-09-13 LAB — URINALYSIS, ROUTINE W REFLEX MICROSCOPIC
Bacteria, UA: NONE SEEN
Bilirubin Urine: NEGATIVE
Glucose, UA: NEGATIVE mg/dL
Hgb urine dipstick: NEGATIVE
Ketones, ur: NEGATIVE mg/dL
Nitrite: NEGATIVE
Protein, ur: NEGATIVE mg/dL
Specific Gravity, Urine: 1.016 (ref 1.005–1.030)
pH: 5 (ref 5.0–8.0)

## 2024-09-13 MED ORDER — HYDROCORTISONE 1 % EX CREA: TOPICAL_CREAM | CUTANEOUS | 0 refills | Status: DC

## 2024-09-13 NOTE — ED Provider Notes (Signed)
 [place] [facility] AT [facility] Provider Note   CSN: [id number] Arrival date & time: 09/13/24  1249     Patient presents with: Vaginal Itching   Sarah Horn is a 33 y.o. female who presents to the ED with a vaginal rash that began 4 days ago. The symptoms started after she used [employer].  Patient states that she has a high sensitivity to soaps and detergents and this was the first time using this soap.  Patient states that the rash is pruritic in nature.  Patient denies any urinary symptoms, pain, or vaginal discharge. Patient states that she has been using Desitin with minimal symptom relief.    Vaginal Itching       Prior to Admission medications   Medication Sig Start Date End Date Taking? Authorizing Provider  hydrocortisone cream 1 % Apply to affected area 2 times daily. EXTERNAL USE ONLY. 09/13/24  Yes Levi Crass L, PA    Allergies: Celery (apium graveolens var. dulce) skin test and Pineapple    Review of Systems  Genitourinary:        Vaginal pruritus     Updated Vital Signs BP 129/72 (BP Location: Left Arm)   Pulse 66   Temp 98 F (36.7 C) (Oral)   Resp 16   Wt 70.3 kg   LMP 09/06/2024 (Approximate)   SpO2 99%   BMI 25.79 kg/m   Physical Exam Vitals and nursing note reviewed. Exam conducted with a chaperone present (PA student present as chaperone).  Constitutional:      General: She is not in acute distress.    Appearance: Normal appearance.  HENT:     Head: Normocephalic and atraumatic.  Eyes:     Extraocular Movements: Extraocular movements intact.     Conjunctiva/sclera: Conjunctivae normal.     Pupils: Pupils are equal, round, and reactive to light.  Cardiovascular:     Rate and Rhythm: Normal rate and regular rhythm.     Pulses: Normal pulses.  Pulmonary:     Effort: Pulmonary effort is normal. No respiratory distress.  Abdominal:     General: Abdomen is flat.     Palpations: Abdomen is soft.      Tenderness: There is no abdominal tenderness.  Genitourinary:    Pubic Area: Rash present. No pubic lice.      Labia:        Right: Rash present.        Left: Rash present.      Urethra: No urethral pain.     Vagina: Normal.     Cervix: Normal.     Uterus: Normal.       Comments: There is a dermatitis appearing rash, localized to the mons pubis and superior labia majora. No pain. No discharge.  Musculoskeletal:        General: Normal range of motion.     Cervical back: Normal range of motion.  Skin:    General: Skin is warm and dry.     Capillary Refill: Capillary refill takes less than 2 seconds.  Neurological:     General: No focal deficit present.     Mental Status: She is alert. Mental status is at baseline.  Psychiatric:        Mood and Affect: Mood normal.     (all labs ordered are listed, but only abnormal results are displayed) Labs Reviewed  URINALYSIS, ROUTINE W REFLEX MICROSCOPIC - Abnormal; Notable for the following components:  Result Value   Leukocytes,Ua TRACE (*)    All other components within normal limits  WET PREP, GENITAL  GC/CHLAMYDIA PROBE AMP ([place]) NOT AT [facility]    EKG: None  Radiology: No results found.   Procedures   Medications Ordered in the ED - No data to display                                  Medical Decision Making Amount and/or Complexity of Data Reviewed Labs: ordered.   Patient presents to the ED for concern of vaginal rash, this involves an extensive number of treatment options, and is a complaint that carries with it a high risk of complications and morbidity.    The differential diagnosis includes: Allergy/contact dermatitis Vaginal Candida infection Sexually transmitted infections  The patient was a reliable historian, providing a clear, detailed, and consistent account of the presenting symptoms and relevant medical history. The information was obtained directly from the patient and statements were  documented in the patient's own words when possible. No discrepancies were noted between the history provided and available collateral sources.     Lab Tests: I ordered, and personally interpreted labs.   The pertinent results include:  Urinalysis - no acute findings Wet prep -negative GC chlamydia -pending  Problem List / ED Course: Problem List: Vaginal rash Emergency Department Course: The patient presented with vaginal rash x 4 days after using a new soap in the shower. Initial assessment included history, physical exam, and review of prior medical records.  Given patient's symptoms, and shared decision making-pelvic examination was completed.  Pelvic exam showed a small amount of what appeared to be allergic contact dermatitis to the mons pubis and inferior labia majora, no signs of Candida infection, no signs of sexual transmitted infection, the rest of the pelvic exam was unremarkable - no discharge or pain noted.  Patient tolerated pelvic exam well.  All vitals stayed WNL for duration of visit.  Patient's urinalysis showed trace leukocytes-given patient is asymptomatic this is assumed to be a possible bad urine catch.  Wet prep was negative.  GC chlamydia pending at the time of discharge. Dispostion: After consideration of the laboratory results and the patients physical exam findings, I feel that the patent would benefit from discharge home with supportive care measures, short topical steroid treatment regimen, and close follow-up with her gynecologist for further assessment and care.  Clinical Assessment:    Working diagnosis: Vaginal pruritus, allergic contact dermatitis Disposition Plan: The patient is medically stable for discharge from the Emergency Department at this time. Vital signs are within normal limits, and the patient is alert, oriented, and in no acute distress. Evaluation has been completed with no findings necessitating hospital admission or further emergent workup.   Communication:   Patient informed of disposition decision and rationale. Questions addressed.  The results and clinical impression were discussed with the patient at bedside and the patient demonstrated understanding.     Final diagnoses:  Vaginal itching    ED Discharge Orders          Ordered    hydrocortisone cream 1 %        09/13/24 1711               Willma Duwaine CROME, [place] 09/17/24 1008    Suzette Pac, MD 09/18/24 1014

## 2024-09-13 NOTE — Discharge Instructions (Addendum)
 Thank you for visiting the Emergency Department today. It was a pleasure to be part of your healthcare team.  Your exam today showed no acute findings, however you are leaving with you laboratory studies still pending. As discussed, please follow up with your PCP in 1 week or sooner pending your results. It is important to watch for warning signs such as worsening pain, fever, or discharge. If any of these happen, return to the Emergency Department or call 911. Thank you for trusting us  with your health.

## 2024-09-13 NOTE — ED Triage Notes (Signed)
 Pt reports she used summers [employer] soap and now has sever vaginal itching, denies any discharge.

## 2024-09-14 LAB — GC/CHLAMYDIA PROBE AMP (~~LOC~~) NOT AT ARMC
Chlamydia: NEGATIVE
Comment: NEGATIVE
Comment: NORMAL
Neisseria Gonorrhea: NEGATIVE

## 2024-11-08 ENCOUNTER — Ambulatory Visit (INDEPENDENT_AMBULATORY_CARE_PROVIDER_SITE_OTHER): Payer: Self-pay | Admitting: Family Medicine

## 2024-11-08 ENCOUNTER — Encounter: Payer: Self-pay | Admitting: Family Medicine

## 2024-11-08 VITALS — BP 117/79 | HR 72 | Resp 16 | Ht 65.0 in | Wt 182.0 lb

## 2024-11-08 DIAGNOSIS — Z01818 Encounter for other preprocedural examination: Secondary | ICD-10-CM

## 2024-11-08 DIAGNOSIS — Z7689 Persons encountering health services in other specified circumstances: Secondary | ICD-10-CM

## 2024-11-08 DIAGNOSIS — Z9884 Bariatric surgery status: Secondary | ICD-10-CM

## 2024-11-08 DIAGNOSIS — M1732 Unilateral post-traumatic osteoarthritis, left knee: Secondary | ICD-10-CM

## 2024-11-08 DIAGNOSIS — Z0000 Encounter for general adult medical examination without abnormal findings: Secondary | ICD-10-CM

## 2024-11-08 NOTE — Progress Notes (Signed)
 "    Subjective:     Sarah Horn 1991-03-23 11/08/2024  Chief Complaint  Patient presents with   Establish Care   Discussed the use of AI scribe software for clinical note transcription with the patient, who gave verbal consent to proceed.  History of Present Illness   Sarah Horn is a 34 year old female who presents to establish with [facility] at [facility]. She has a history of osteoarthritis of the knee who presents for pre-operative clearance for left total knee arthroplasty.   She is scheduled for knee replacement surgery due to osteoarthritis, which developed following a fall on ice approximately one year ago. She has a history of knee injuries, including ACL and meniscus tears at ages 11 and 51, leading to the current 'bone on bone' condition. Pain management has primarily been with Tylenol , and she has received steroid injections in the past. Surgery was initially scheduled for January 5th but was delayed due to scheduling issues over the holidays to establish with a primary care office for preoperative clearance.  Her past medical history includes weight loss surgery in 2019, which resolved her sleep apnea. She has a history of irregular menstrual cycles without anemia or heavy bleeding. Her A1c was noted to be in the prediabetic range in 2019, but she has not been diagnosed with diabetes. No history of cardiac issues, clotting disorders, seizures, thyroid, or liver disorders.  She is a former smoker. She has experienced significant weight loss post-surgery but reports some weight gain recently, attributing it to holiday eating. Her vitamin D  levels have been low in the past, likely related to her weight loss surgery and reduced sun exposure during winter. She has not had any previous problems with anesthesia.     1) High Risk Cardiac Conditions:  1) Recent MI - No.  2) Decompensated Heart Failure - No.  3) Unstable angina - No.  4) Symptomatic arrythmia  - No.  5) Sx Valvular Disease - No.  2) Intermediate Risk Factors: DM, CKD, CVA, CHF, CAD - No.  2) Functional Status: > 4 mets (Walk, run, climb stairs) Yes.  Sarah Horn   3) Surgery Specific Risk: High (Emergency, Vascular, Intra-abdominal, Extensive ops)          Intermediate (Carotid, Head and Neck, Orthopaedic)          Low (Endoscopic, Cataract, Breast )  4) Further Noninvasive evaluation:   1) EKG - not indicated    Hx of MI, CVA, CAD, DM, CKD  2) Echo - not indicated    Worsening dyspnea or CHF without an echo in the past year  3) Stress Testing - Active Cardiac Disease - not indicated   4) CXR: not indicated    If asymptomatic, healthy, no respiratory symptoms it is not needed  5)PFTs: not indicated    OSA, OHS, or significant cardiopulmonary history  5) Need for medical therapy - Beta Blocker, Statins indicated ? No.  General Risk Score: 2  Values used to calculate this score:   Points  Metrics      0        Age: 83      0        Hospital Admissions: 0      2        ED Visits: 2      0        Has Chronic Obstructive Pulmonary Disease: No      0  Has Diabetes Excluding Gestational Diabetes: No      0        Has Congestive Heart Failure: No      0        Has Liver Disease: No      0        Has Depression: No      0        Current PCP: Evalene Arts, FNP      0        Has Medicaid: No   Do you usually get chest pain or breathlessness when you climb up two flights of stairs at normal speed?  no Do you have chronic kidney disease? no Have anyone in your family (blood relatives) had a problem following an anaesthetic? no Have you ever had a heart attack? no Have you ever been diagnosed with an irregular heartbeat? no Have you ever had a stroke? no If you have been put to sleep for an operation were there any anaesthetic problems? no Do you suffer from epilepsy or seizures? no Do you have problems with pain, stiffness or arthritis in your neck or jaw? no Do you have  thyroid disease? no Do you have liver disease? no Have you ever been diagnosed with heart failure? no Do you suffer from asthma? no Do you have diabetes that requires insulin? no Do you have diabetes that requires oral medications only? no Do you suffer from chronic bronchitis? no  I have evaluated Sarah Horn related to their upcoming surgery. She is medically stable and cleared for this procedure.  New complaints: None   Social history: Relevant past medical, surgical, family and social history reviewed and updated as indicated. Interim medical history since our last visit reviewed.  Allergies and medications reviewed and updated.  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   Convulsions/seizures (HCC) 05/23/2014   Headache 05/23/2014   Morbid obesity (HCC) 05/23/2014   Amenorrhea 05/16/2014   Migraine without aura and with status migrainosus, not intractable 05/16/2014   Gait abnormality 08/28/2013   S/P left knee arthroscopy 08/20/2013   Tear of lateral meniscus of left knee 08/20/2013   KNEE PAIN 09/23/2008   PATELLAR TENDINITIS 09/23/2008   TEAR MEDIAL MENISCUS 09/25/2007   TEAR A C L 09/25/2007   Past Medical History:  Diagnosis Date   Irregular menstrual cycle    Sleep apnea    resolved with weigh loss surgery   Past Surgical History:  Procedure Laterality Date   ANTERIOR CRUCIATE LIGAMENT REPAIR     left, [facility]; [place]   BREAST BIOPSY  06/30/2011   Procedure: BREAST BIOPSY;  Surgeon: Thresa JAYSON Pulling;  Location: [facility];  Service: General;  Laterality: Left;   EXAMINATION UNDER ANESTHESIA Left 08/17/2013   Procedure: EXAM UNDER ANESTHESIA LEFT KNEE;  Surgeon: Taft FORBES Minerva, MD;  Location: [facility];  Service: Orthopedics;  Laterality: Left;   KNEE ARTHROSCOPY WITH LATERAL MENISECTOMY Left 08/17/2013   Procedure: KNEE ARTHROSCOPY WITH  PARTIAL LATERAL MENISECTOMY, LIMITED DEBRIDEMENT;  Surgeon: Taft FORBES Minerva, MD;  Location: [facility];  Service: Orthopedics;  Laterality: Left;   LAPAROSCOPIC GASTRIC RESTRICTIVE DUODENAL PROCEDURE (DUODENAL SWITCH)  2019   Family History  Problem Relation Age of Onset   Migraines Mother    Anesthesia problems Neg Hx    Outpatient Medications Prior to Visit  Medication Sig Dispense Refill  diclofenac (VOLTAREN) 75 MG EC tablet Take 1 tablet twice a day by oral route with meal(s).     hydrocortisone  cream 1 % Apply to affected area 2 times daily. EXTERNAL USE ONLY. 15 g 0   No facility-administered medications prior to visit.   Allergies[1]  ROS: Negative unless specifically indicated above in HPI.   Current Medications[2]     Objective:    Today's Vitals   11/08/24 1013  BP: 117/79  Pulse: 72  Resp: 16  SpO2: 98%  Weight: 182 lb (82.6 kg)  Height: 5' 5 (1.651 m)  PainSc: 0-No pain    Wt Readings from Last 3 Encounters:  11/08/24 182 lb (82.6 kg)  09/13/24 155 lb (70.3 kg)  11/24/23 190 lb (86.2 kg)    GENERAL: Well-appearing, in NAD. Well nourished.  SKIN: Pink, warm and dry. No rash, lesion, ulceration, or ecchymoses.  Head: Normocephalic. NECK: Trachea midline. Full ROM w/o pain or tenderness. No lymphadenopathy.  EARS: Tympanic membranes are intact, translucent without bulging and without drainage. Appropriate landmarks visualized.  EYES: Conjunctiva clear without exudates. EOMI, PERRL, no drainage present.  THROAT: Uvula midline. Oropharynx clear. Tonsils non-inflamed without exudate. Mucous membranes pink and moist.  RESPIRATORY: Chest wall symmetrical. Respirations even and non-labored. Breath sounds clear to auscultation bilaterally.  CARDIAC: S1, S2 present, regular rate and rhythm without murmur or gallops. Peripheral pulses 2+ bilaterally.  MSK: Muscle tone and strength appropriate for age. Joints w/o tenderness, redness, or swelling.  EXTREMITIES:  Without clubbing, cyanosis, or edema.  NEUROLOGIC: No motor or sensory deficits. Steady, even gait. C2-C12 intact.  PSYCH/MENTAL STATUS: Alert, oriented x 3. Cooperative, appropriate mood and affect.   The ASCVD Risk score (Arnett DK, et al., 2019) failed to calculate for the following reasons:   The 2019 ASCVD risk score is only valid for ages 35 to 92   * - Cholesterol units were assumed    Assessment & Plan:   1. Encounter to establish care (Primary) Patient is a 27- year-old female who presents today to establish care with primary care at [facility]. Reviewed the past medical history, family history, social history, surgical history, medications and allergies today- updates made as indicated. Patient has concerns today about pre-operative surgical clearance.   2. Post-traumatic osteoarthritis of left knee Chronic knee osteoarthritis with bone-on-bone contact, requiring knee replacement surgery. Previous ACL and meniscus tears with multiple surgeries. Currently managed with Tylenol  for pain. Pre-operative clearance completed today with no concerns for patient being high risk.   3. Healthcare maintenance Will obtain baseline labs for pre-operative clearance.  - CBC with Differential/Platelet - VITAMIN D  25 Hydroxy (Vit-D Deficiency, Fractures) - Comprehensive metabolic panel with GFR - TSH Rfx on Abnormal to Free T4 - Hemoglobin A1c  4. Preoperative clearance Preoperative evaluation for knee replacement surgery. No cardiac, pulmonary, or renal issues. Blood pressure, heart rate, and oxygen  levels are normal. No history of anesthesia complications, seizures, or thyroid disorders. Former smoker, with occasional use, advised to avoid nicotine to prevent delayed healing. Ordered CBC, kidney function, and electrolytes. Will fax office note, records, and labs for surgical clearance.  5. History of bariatric surgery Bariatric surgery in 2019 with significant weight loss of 150 pounds. Reports  she has continued to do well with her weight, noting her weight in office is due to excess clothing and boots.    I have independently evaluated patient.  Sarah Horn is a 34 y.o. female who is low risk for an orthopedic risk surgery.  There are modifiable risk factors (smoking, etc).  Return in about 3 months (around 02/06/2025) for Physical w/ pap .  Patient to reach out to office if new, worrisome, or unresolved symptoms arise or if no improvement in patient's condition. Patient verbalized understanding and is agreeable to treatment plan. All questions answered to patient's satisfaction.   Evalene Arts, FNP-C      [1]  Allergies Allergen Reactions   Apium Graveolens Var. Dulce    Pineapple   [2] No current outpatient medications on file.  "

## 2024-11-08 NOTE — Patient Instructions (Signed)

## 2024-11-14 ENCOUNTER — Ambulatory Visit: Payer: Self-pay | Admitting: Family Medicine

## 2024-11-14 LAB — CBC WITH DIFFERENTIAL/PLATELET
Basophils Absolute: 0.1 x10E3/uL (ref 0.0–0.2)
Basos: 1 %
EOS (ABSOLUTE): 0 x10E3/uL (ref 0.0–0.4)
Eos: 1 %
Hematocrit: 35.2 % (ref 34.0–46.6)
Hemoglobin: 10.7 g/dL — ABNORMAL LOW (ref 11.1–15.9)
Immature Grans (Abs): 0 x10E3/uL (ref 0.0–0.1)
Immature Granulocytes: 0 %
Lymphocytes Absolute: 2.6 x10E3/uL (ref 0.7–3.1)
Lymphs: 40 %
MCH: 27.5 pg (ref 26.6–33.0)
MCHC: 30.4 g/dL — ABNORMAL LOW (ref 31.5–35.7)
MCV: 91 fL (ref 79–97)
Monocytes Absolute: 0.6 x10E3/uL (ref 0.1–0.9)
Monocytes: 10 %
Neutrophils Absolute: 3.1 x10E3/uL (ref 1.4–7.0)
Neutrophils: 48 %
Platelets: 212 x10E3/uL (ref 150–450)
RBC: 3.89 x10E6/uL (ref 3.77–5.28)
RDW: 14.8 % (ref 11.7–15.4)
WBC: 6.4 x10E3/uL (ref 3.4–10.8)

## 2024-11-14 LAB — HEMOGLOBIN A1C
Est. average glucose Bld gHb Est-mCnc: 97 mg/dL
Hgb A1c MFr Bld: 5 % (ref 4.8–5.6)

## 2024-11-14 LAB — COMPREHENSIVE METABOLIC PANEL WITH GFR
ALT: 16 IU/L (ref 0–32)
AST: 25 IU/L (ref 0–40)
Albumin: 3.9 g/dL (ref 3.9–4.9)
Alkaline Phosphatase: 70 IU/L (ref 41–116)
BUN/Creatinine Ratio: 7 — ABNORMAL LOW (ref 9–23)
BUN: 5 mg/dL — ABNORMAL LOW (ref 6–20)
Bilirubin Total: 0.7 mg/dL (ref 0.0–1.2)
CO2: 21 mmol/L (ref 20–29)
Calcium: 8.7 mg/dL (ref 8.7–10.2)
Chloride: 107 mmol/L — ABNORMAL HIGH (ref 96–106)
Creatinine, Ser: 0.68 mg/dL (ref 0.57–1.00)
Globulin, Total: 2.5 g/dL (ref 1.5–4.5)
Glucose: 72 mg/dL (ref 70–99)
Potassium: 4.1 mmol/L (ref 3.5–5.2)
Sodium: 140 mmol/L (ref 134–144)
Total Protein: 6.4 g/dL (ref 6.0–8.5)
eGFR: 118 mL/min/1.73

## 2024-11-14 LAB — TSH RFX ON ABNORMAL TO FREE T4: TSH: 1.61 u[IU]/mL (ref 0.450–4.500)

## 2024-11-14 LAB — VITAMIN D 25 HYDROXY (VIT D DEFICIENCY, FRACTURES): Vit D, 25-Hydroxy: 4.5 ng/mL — ABNORMAL LOW (ref 30.0–100.0)

## 2024-11-14 MED ORDER — VITAMIN D (ERGOCALCIFEROL) 1.25 MG (50000 UNIT) PO CAPS: 50000.0000 [IU] | ORAL_CAPSULE | ORAL | 0 refills | Status: AC

## 2024-11-20 ENCOUNTER — Telehealth: Payer: Self-pay | Admitting: Family Medicine

## 2024-11-20 NOTE — Telephone Encounter (Signed)
 Copied from CRM #[id number]. Topic: Medical Record Request - Provider/Facility Request >> Nov 20, 2024  1:08 PM Ivette P wrote: Reason for CRM: Sherri called in to see if surgical clearance can be sent to her directly as soon as available.   Also include last office notes and most recent labs.    Fax Number to get it directly - [phone]   Sherri - [facility] [phone]

## 2025-02-06 ENCOUNTER — Ambulatory Visit: Payer: Self-pay | Admitting: Family Medicine
